# Patient Record
Sex: Female | Born: 1980 | Race: Black or African American | Hispanic: No | Marital: Single | Smoking: Former smoker
Health system: Southern US, Community
[De-identification: ages and names within clinical notes are randomized; demographics above are authoritative.]

## PROBLEM LIST (undated history)

## (undated) DIAGNOSIS — IMO0002 Reserved for concepts with insufficient information to code with codable children: Secondary | ICD-10-CM

## (undated) DIAGNOSIS — D696 Thrombocytopenia, unspecified: Secondary | ICD-10-CM

## (undated) DIAGNOSIS — R87629 Unspecified abnormal cytological findings in specimens from vagina: Secondary | ICD-10-CM

## (undated) DIAGNOSIS — N83209 Unspecified ovarian cyst, unspecified side: Secondary | ICD-10-CM

## (undated) DIAGNOSIS — T7840XA Allergy, unspecified, initial encounter: Secondary | ICD-10-CM

## (undated) DIAGNOSIS — R519 Headache, unspecified: Secondary | ICD-10-CM

## (undated) DIAGNOSIS — N390 Urinary tract infection, site not specified: Secondary | ICD-10-CM

## (undated) DIAGNOSIS — B009 Herpesviral infection, unspecified: Secondary | ICD-10-CM

## (undated) DIAGNOSIS — F329 Major depressive disorder, single episode, unspecified: Secondary | ICD-10-CM

## (undated) DIAGNOSIS — Z8042 Family history of malignant neoplasm of prostate: Secondary | ICD-10-CM

## (undated) DIAGNOSIS — M5431 Sciatica, right side: Secondary | ICD-10-CM

## (undated) DIAGNOSIS — F32A Depression, unspecified: Secondary | ICD-10-CM

## (undated) DIAGNOSIS — G8929 Other chronic pain: Secondary | ICD-10-CM

## (undated) DIAGNOSIS — R011 Cardiac murmur, unspecified: Secondary | ICD-10-CM

## (undated) DIAGNOSIS — R87619 Unspecified abnormal cytological findings in specimens from cervix uteri: Secondary | ICD-10-CM

## (undated) DIAGNOSIS — Z800 Family history of malignant neoplasm of digestive organs: Secondary | ICD-10-CM

## (undated) DIAGNOSIS — R51 Headache: Secondary | ICD-10-CM

## (undated) DIAGNOSIS — Z8049 Family history of malignant neoplasm of other genital organs: Secondary | ICD-10-CM

## (undated) DIAGNOSIS — G43909 Migraine, unspecified, not intractable, without status migrainosus: Secondary | ICD-10-CM

## (undated) DIAGNOSIS — Z8489 Family history of other specified conditions: Secondary | ICD-10-CM

## (undated) DIAGNOSIS — D693 Immune thrombocytopenic purpura: Secondary | ICD-10-CM

## (undated) HISTORY — DX: Urinary tract infection, site not specified: N39.0

## (undated) HISTORY — DX: Family history of malignant neoplasm of other genital organs: Z80.49

## (undated) HISTORY — DX: Sciatica, right side: M54.31

## (undated) HISTORY — DX: Cardiac murmur, unspecified: R01.1

## (undated) HISTORY — DX: Major depressive disorder, single episode, unspecified: F32.9

## (undated) HISTORY — DX: Family history of malignant neoplasm of prostate: Z80.42

## (undated) HISTORY — DX: Immune thrombocytopenic purpura: D69.3

## (undated) HISTORY — DX: Allergy, unspecified, initial encounter: T78.40XA

## (undated) HISTORY — DX: Headache: R51

## (undated) HISTORY — PX: INDUCED ABORTION: SHX677

## (undated) HISTORY — DX: Family history of malignant neoplasm of digestive organs: Z80.0

## (undated) HISTORY — DX: Herpesviral infection, unspecified: B00.9

## (undated) HISTORY — DX: Migraine, unspecified, not intractable, without status migrainosus: G43.909

## (undated) HISTORY — DX: Depression, unspecified: F32.A

## (undated) HISTORY — DX: Headache, unspecified: R51.9

## (undated) HISTORY — DX: Other chronic pain: G89.29

---
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
## 2002-01-14 ENCOUNTER — Other Ambulatory Visit: Admission: RE | Admit: 2002-01-14 | Discharge: 2002-01-14 | Payer: Self-pay | Admitting: Obstetrics and Gynecology

## 2002-08-11 ENCOUNTER — Other Ambulatory Visit: Admission: RE | Admit: 2002-08-11 | Discharge: 2002-08-11 | Payer: Self-pay | Admitting: Obstetrics and Gynecology

## 2002-09-10 ENCOUNTER — Encounter: Payer: Self-pay | Admitting: Emergency Medicine

## 2002-09-10 ENCOUNTER — Emergency Department (HOSPITAL_COMMUNITY): Admission: EM | Admit: 2002-09-10 | Discharge: 2002-09-10 | Payer: Self-pay | Admitting: Emergency Medicine

## 2002-10-09 ENCOUNTER — Emergency Department (HOSPITAL_COMMUNITY): Admission: EM | Admit: 2002-10-09 | Discharge: 2002-10-10 | Payer: Self-pay | Admitting: Emergency Medicine

## 2002-10-18 ENCOUNTER — Emergency Department (HOSPITAL_COMMUNITY): Admission: EM | Admit: 2002-10-18 | Discharge: 2002-10-18 | Payer: Self-pay | Admitting: Emergency Medicine

## 2003-05-02 ENCOUNTER — Inpatient Hospital Stay (HOSPITAL_COMMUNITY): Admission: AD | Admit: 2003-05-02 | Discharge: 2003-05-04 | Payer: Self-pay | Admitting: Obstetrics and Gynecology

## 2003-05-07 ENCOUNTER — Observation Stay (HOSPITAL_COMMUNITY): Admission: AD | Admit: 2003-05-07 | Discharge: 2003-05-07 | Payer: Self-pay | Admitting: Obstetrics and Gynecology

## 2003-05-14 ENCOUNTER — Inpatient Hospital Stay (HOSPITAL_COMMUNITY): Admission: AD | Admit: 2003-05-14 | Discharge: 2003-05-17 | Payer: Self-pay | Admitting: Obstetrics & Gynecology

## 2003-05-21 ENCOUNTER — Encounter: Admission: RE | Admit: 2003-05-21 | Discharge: 2003-06-20 | Payer: Self-pay | Admitting: Obstetrics and Gynecology

## 2003-06-21 ENCOUNTER — Encounter: Admission: RE | Admit: 2003-06-21 | Discharge: 2003-07-21 | Payer: Self-pay | Admitting: Obstetrics and Gynecology

## 2003-07-22 ENCOUNTER — Encounter: Admission: RE | Admit: 2003-07-22 | Discharge: 2003-08-21 | Payer: Self-pay | Admitting: Obstetrics and Gynecology

## 2003-08-22 ENCOUNTER — Encounter: Admission: RE | Admit: 2003-08-22 | Discharge: 2003-09-21 | Payer: Self-pay | Admitting: Obstetrics and Gynecology

## 2003-10-12 ENCOUNTER — Emergency Department (HOSPITAL_COMMUNITY): Admission: EM | Admit: 2003-10-12 | Discharge: 2003-10-12 | Payer: Self-pay | Admitting: Emergency Medicine

## 2003-10-22 ENCOUNTER — Encounter: Admission: RE | Admit: 2003-10-22 | Discharge: 2003-11-21 | Payer: Self-pay | Admitting: Obstetrics and Gynecology

## 2003-11-19 ENCOUNTER — Inpatient Hospital Stay (HOSPITAL_COMMUNITY): Admission: AD | Admit: 2003-11-19 | Discharge: 2003-11-19 | Payer: Self-pay | Admitting: Obstetrics and Gynecology

## 2004-08-27 ENCOUNTER — Emergency Department (HOSPITAL_COMMUNITY): Admission: EM | Admit: 2004-08-27 | Discharge: 2004-08-27 | Payer: Self-pay | Admitting: Family Medicine

## 2004-09-13 ENCOUNTER — Emergency Department (HOSPITAL_COMMUNITY): Admission: EM | Admit: 2004-09-13 | Discharge: 2004-09-13 | Payer: Self-pay | Admitting: Family Medicine

## 2004-10-21 ENCOUNTER — Inpatient Hospital Stay (HOSPITAL_COMMUNITY): Admission: AD | Admit: 2004-10-21 | Discharge: 2004-10-22 | Payer: Self-pay | Admitting: Obstetrics & Gynecology

## 2004-10-28 ENCOUNTER — Inpatient Hospital Stay (HOSPITAL_COMMUNITY): Admission: AD | Admit: 2004-10-28 | Discharge: 2004-10-28 | Payer: Self-pay | Admitting: Family Medicine

## 2004-10-30 ENCOUNTER — Inpatient Hospital Stay (HOSPITAL_COMMUNITY): Admission: AD | Admit: 2004-10-30 | Discharge: 2004-10-30 | Payer: Self-pay | Admitting: Obstetrics and Gynecology

## 2004-11-18 ENCOUNTER — Emergency Department (HOSPITAL_COMMUNITY): Admission: EM | Admit: 2004-11-18 | Discharge: 2004-11-18 | Payer: Self-pay | Admitting: Family Medicine

## 2005-01-04 ENCOUNTER — Emergency Department (HOSPITAL_COMMUNITY): Admission: EM | Admit: 2005-01-04 | Discharge: 2005-01-04 | Payer: Self-pay | Admitting: Family Medicine

## 2005-07-04 ENCOUNTER — Inpatient Hospital Stay (HOSPITAL_COMMUNITY): Admission: AD | Admit: 2005-07-04 | Discharge: 2005-07-04 | Payer: Self-pay | Admitting: *Deleted

## 2005-09-18 ENCOUNTER — Inpatient Hospital Stay (HOSPITAL_COMMUNITY): Admission: AD | Admit: 2005-09-18 | Discharge: 2005-09-19 | Payer: Self-pay | Admitting: Internal Medicine

## 2005-11-13 ENCOUNTER — Inpatient Hospital Stay (HOSPITAL_COMMUNITY): Admission: AD | Admit: 2005-11-13 | Discharge: 2005-11-13 | Payer: Self-pay | Admitting: Gynecology

## 2006-04-14 ENCOUNTER — Inpatient Hospital Stay (HOSPITAL_COMMUNITY): Admission: AD | Admit: 2006-04-14 | Discharge: 2006-04-14 | Payer: Self-pay | Admitting: Family Medicine

## 2006-08-09 ENCOUNTER — Emergency Department (HOSPITAL_COMMUNITY): Admission: EM | Admit: 2006-08-09 | Discharge: 2006-08-09 | Payer: Self-pay | Admitting: Emergency Medicine

## 2008-02-10 ENCOUNTER — Emergency Department (HOSPITAL_COMMUNITY): Admission: EM | Admit: 2008-02-10 | Discharge: 2008-02-10 | Payer: Self-pay | Admitting: Family Medicine

## 2008-03-04 ENCOUNTER — Emergency Department (HOSPITAL_COMMUNITY): Admission: EM | Admit: 2008-03-04 | Discharge: 2008-03-04 | Payer: Self-pay | Admitting: Family Medicine

## 2009-01-07 ENCOUNTER — Emergency Department (HOSPITAL_COMMUNITY): Admission: EM | Admit: 2009-01-07 | Discharge: 2009-01-07 | Payer: Self-pay | Admitting: Emergency Medicine

## 2009-01-10 ENCOUNTER — Emergency Department (HOSPITAL_COMMUNITY): Admission: EM | Admit: 2009-01-10 | Discharge: 2009-01-10 | Payer: Self-pay | Admitting: Family Medicine

## 2009-01-17 ENCOUNTER — Emergency Department (HOSPITAL_COMMUNITY): Admission: EM | Admit: 2009-01-17 | Discharge: 2009-01-17 | Payer: Self-pay | Admitting: Emergency Medicine

## 2009-02-22 ENCOUNTER — Emergency Department (HOSPITAL_COMMUNITY): Admission: EM | Admit: 2009-02-22 | Discharge: 2009-02-22 | Payer: Self-pay | Admitting: Emergency Medicine

## 2009-03-02 ENCOUNTER — Emergency Department (HOSPITAL_COMMUNITY): Admission: EM | Admit: 2009-03-02 | Discharge: 2009-03-02 | Payer: Self-pay | Admitting: Emergency Medicine

## 2009-03-05 ENCOUNTER — Ambulatory Visit: Payer: Self-pay | Admitting: Oncology

## 2009-03-12 ENCOUNTER — Emergency Department (HOSPITAL_COMMUNITY): Admission: EM | Admit: 2009-03-12 | Discharge: 2009-03-12 | Payer: Self-pay | Admitting: Family Medicine

## 2009-04-05 ENCOUNTER — Other Ambulatory Visit: Payer: Self-pay | Admitting: Oncology

## 2009-04-05 ENCOUNTER — Ambulatory Visit: Payer: Self-pay | Admitting: Oncology

## 2009-04-05 LAB — CBC WITH DIFFERENTIAL/PLATELET
BASO%: 0.5 % (ref 0.0–2.0)
Basophils Absolute: 0 10e3/uL (ref 0.0–0.1)
EOS%: 3.2 % (ref 0.0–7.0)
Eosinophils Absolute: 0.1 10e3/uL (ref 0.0–0.5)
HCT: 38 % (ref 34.8–46.6)
HGB: 12.9 g/dL (ref 11.6–15.9)
LYMPH%: 46.9 % (ref 14.0–49.7)
MCH: 30.9 pg (ref 25.1–34.0)
MCHC: 34.1 g/dL (ref 31.5–36.0)
MCV: 90.6 fL (ref 79.5–101.0)
MONO#: 0.3 10e3/uL (ref 0.1–0.9)
MONO%: 8.2 % (ref 0.0–14.0)
NEUT#: 1.6 10e3/uL (ref 1.5–6.5)
NEUT%: 41.2 % (ref 38.4–76.8)
Platelets: 90 10e3/uL — ABNORMAL LOW (ref 145–400)
RBC: 4.19 10e6/uL (ref 3.70–5.45)
RDW: 12 % (ref 11.2–14.5)
WBC: 3.8 10e3/uL — ABNORMAL LOW (ref 3.9–10.3)
lymph#: 1.8 10e3/uL (ref 0.9–3.3)

## 2009-04-05 LAB — CHCC SMEAR

## 2009-04-05 LAB — TECHNOLOGIST REVIEW

## 2009-04-06 LAB — ANA: Anti Nuclear Antibody(ANA): NEGATIVE

## 2009-04-06 LAB — RHEUMATOID FACTOR: Rhuematoid fact SerPl-aCnc: <20 IU/mL (ref 0–20)

## 2009-04-20 ENCOUNTER — Emergency Department (HOSPITAL_COMMUNITY): Admission: EM | Admit: 2009-04-20 | Discharge: 2009-04-20 | Payer: Self-pay | Admitting: Family Medicine

## 2009-06-21 ENCOUNTER — Emergency Department (HOSPITAL_COMMUNITY): Admission: EM | Admit: 2009-06-21 | Discharge: 2009-06-21 | Payer: Self-pay | Admitting: Emergency Medicine

## 2009-07-02 ENCOUNTER — Ambulatory Visit: Payer: Self-pay | Admitting: Oncology

## 2009-07-06 LAB — CBC WITH DIFFERENTIAL/PLATELET
Eosinophils Absolute: 0.2 10*3/uL (ref 0.0–0.5)
HCT: 38.8 % (ref 34.8–46.6)
HGB: 13.2 g/dL (ref 11.6–15.9)
MCH: 30.9 pg (ref 25.1–34.0)
MCV: 90.7 fL (ref 79.5–101.0)
MONO%: 7.2 % (ref 0.0–14.0)
NEUT#: 2.5 10*3/uL (ref 1.5–6.5)
Platelets: 84 10*3/uL — ABNORMAL LOW (ref 145–400)
RBC: 4.28 10*6/uL (ref 3.70–5.45)
WBC: 4.9 10*3/uL (ref 3.9–10.3)

## 2009-08-12 ENCOUNTER — Emergency Department (HOSPITAL_COMMUNITY): Admission: EM | Admit: 2009-08-12 | Discharge: 2009-08-12 | Payer: Self-pay | Admitting: Emergency Medicine

## 2009-10-29 ENCOUNTER — Ambulatory Visit: Payer: Self-pay | Admitting: Oncology

## 2009-11-02 LAB — CBC WITH DIFFERENTIAL/PLATELET
Basophils Absolute: 0 10*3/uL (ref 0.0–0.1)
Eosinophils Absolute: 0.2 10*3/uL (ref 0.0–0.5)
HCT: 39.9 % (ref 34.8–46.6)
HGB: 13.6 g/dL (ref 11.6–15.9)
LYMPH%: 52.4 % — ABNORMAL HIGH (ref 14.0–49.7)
MCH: 29.7 pg (ref 25.1–34.0)
MONO#: 0.5 10*3/uL (ref 0.1–0.9)
NEUT%: 33.1 % — ABNORMAL LOW (ref 38.4–76.8)
Platelets: 83 10*3/uL — ABNORMAL LOW (ref 145–400)
WBC: 4.7 10*3/uL (ref 3.9–10.3)

## 2010-01-26 ENCOUNTER — Emergency Department (HOSPITAL_COMMUNITY): Admission: EM | Admit: 2010-01-26 | Discharge: 2010-01-26 | Payer: Self-pay | Admitting: Family Medicine

## 2010-03-02 ENCOUNTER — Ambulatory Visit: Payer: Self-pay | Admitting: Oncology

## 2010-03-04 LAB — CBC WITH DIFFERENTIAL/PLATELET
BASO%: 0.5 % (ref 0.0–2.0)
EOS%: 6.2 % (ref 0.0–7.0)
HGB: 13.2 g/dL (ref 11.6–15.9)
LYMPH%: 46.3 % (ref 14.0–49.7)
MCH: 30.9 pg (ref 25.1–34.0)
MCHC: 34.7 g/dL (ref 31.5–36.0)
MONO%: 11.2 % (ref 0.0–14.0)
NEUT#: 1.5 10*3/uL (ref 1.5–6.5)
Platelets: 122 10*3/uL — ABNORMAL LOW (ref 145–400)

## 2010-06-12 ENCOUNTER — Encounter: Payer: Self-pay | Admitting: Obstetrics & Gynecology

## 2010-08-04 LAB — POCT URINALYSIS DIPSTICK
Bilirubin Urine: NEGATIVE
Nitrite: NEGATIVE
Protein, ur: NEGATIVE mg/dL
Specific Gravity, Urine: 1.01 (ref 1.005–1.030)
Urobilinogen, UA: 0.2 mg/dL (ref 0.0–1.0)
pH: 7 (ref 5.0–8.0)

## 2010-08-04 LAB — GC/CHLAMYDIA PROBE AMP, GENITAL
Chlamydia, DNA Probe: NEGATIVE
GC Probe Amp, Genital: NEGATIVE

## 2010-08-04 LAB — WET PREP, GENITAL: Yeast Wet Prep HPF POC: NONE SEEN

## 2010-08-12 LAB — POCT URINALYSIS DIP (DEVICE)
Bilirubin Urine: NEGATIVE
Hgb urine dipstick: NEGATIVE
Nitrite: NEGATIVE
Protein, ur: NEGATIVE mg/dL
Specific Gravity, Urine: 1.01 (ref 1.005–1.030)

## 2010-08-25 LAB — POCT I-STAT, CHEM 8
Chloride: 104 mEq/L (ref 96–112)
Creatinine, Ser: 1.1 mg/dL (ref 0.4–1.2)
HCT: 43 % (ref 36.0–46.0)
Potassium: 4.2 mEq/L (ref 3.5–5.1)
Sodium: 138 mEq/L (ref 135–145)

## 2010-08-25 LAB — CBC
HCT: 39.5 % (ref 36.0–46.0)
Hemoglobin: 13.7 g/dL (ref 12.0–15.0)
MCHC: 34.8 g/dL (ref 30.0–36.0)
Platelets: 83 10*3/uL — ABNORMAL LOW (ref 150–400)
RBC: 4.37 MIL/uL (ref 3.87–5.11)
WBC: 3.6 10*3/uL — ABNORMAL LOW (ref 4.0–10.5)

## 2010-08-25 LAB — POCT URINALYSIS DIP (DEVICE)
Protein, ur: 100 mg/dL — AB
Specific Gravity, Urine: 1.015 (ref 1.005–1.030)
Urobilinogen, UA: 0.2 mg/dL (ref 0.0–1.0)

## 2010-08-25 LAB — DIFFERENTIAL
Basophils Absolute: 0 10*3/uL (ref 0.0–0.1)
Basophils Relative: 1 % (ref 0–1)
Lymphs Abs: 2 10*3/uL (ref 0.7–4.0)
Monocytes Relative: 9 % (ref 3–12)

## 2010-08-25 LAB — HIV ANTIBODY (ROUTINE TESTING W REFLEX): HIV: NONREACTIVE

## 2010-08-25 LAB — POCT PREGNANCY, URINE: Preg Test, Ur: NEGATIVE

## 2010-08-27 LAB — POCT URINALYSIS DIP (DEVICE)
Bilirubin Urine: NEGATIVE
Hgb urine dipstick: NEGATIVE
Nitrite: NEGATIVE
Protein, ur: NEGATIVE mg/dL
Urobilinogen, UA: 1 mg/dL (ref 0.0–1.0)

## 2010-08-27 LAB — CBC
HCT: 38.3 % (ref 36.0–46.0)
HCT: 39.4 % (ref 36.0–46.0)
Hemoglobin: 12.9 g/dL (ref 12.0–15.0)
MCHC: 33.8 g/dL (ref 30.0–36.0)
MCV: 90.5 fL (ref 78.0–100.0)
MCV: 90.7 fL (ref 78.0–100.0)
RBC: 4.23 MIL/uL (ref 3.87–5.11)
RDW: 12.9 % (ref 11.5–15.5)

## 2010-08-27 LAB — DIFFERENTIAL
Basophils Absolute: 0 10*3/uL (ref 0.0–0.1)
Basophils Relative: 1 % (ref 0–1)
Eosinophils Absolute: 0.2 10*3/uL (ref 0.0–0.7)
Eosinophils Relative: 3 % (ref 0–5)
Lymphocytes Relative: 27 % (ref 12–46)
Monocytes Absolute: 0.4 10*3/uL (ref 0.1–1.0)
Monocytes Absolute: 0.6 10*3/uL (ref 0.1–1.0)
Monocytes Relative: 11 % (ref 3–12)
Monocytes Relative: 9 % (ref 3–12)
Neutro Abs: 3.9 10*3/uL (ref 1.7–7.7)
Neutrophils Relative %: 61 % (ref 43–77)

## 2010-08-27 LAB — GC/CHLAMYDIA PROBE AMP, GENITAL: GC Probe Amp, Genital: NEGATIVE

## 2010-09-03 ENCOUNTER — Inpatient Hospital Stay (HOSPITAL_COMMUNITY)
Admission: AD | Admit: 2010-09-03 | Discharge: 2010-09-03 | Disposition: A | Discharge status: Home / Self Care | Payer: Self-pay | Source: Ambulatory Visit | Attending: Obstetrics and Gynecology | Admitting: Obstetrics and Gynecology

## 2010-09-03 ENCOUNTER — Inpatient Hospital Stay (HOSPITAL_COMMUNITY): Payer: Self-pay

## 2010-09-03 DIAGNOSIS — R1032 Left lower quadrant pain: Secondary | ICD-10-CM

## 2010-09-03 DIAGNOSIS — B9689 Other specified bacterial agents as the cause of diseases classified elsewhere: Secondary | ICD-10-CM | POA: Insufficient documentation

## 2010-09-03 DIAGNOSIS — N83209 Unspecified ovarian cyst, unspecified side: Secondary | ICD-10-CM | POA: Insufficient documentation

## 2010-09-03 DIAGNOSIS — A499 Bacterial infection, unspecified: Secondary | ICD-10-CM

## 2010-09-03 DIAGNOSIS — N760 Acute vaginitis: Secondary | ICD-10-CM | POA: Insufficient documentation

## 2010-09-03 LAB — URINALYSIS, ROUTINE W REFLEX MICROSCOPIC
Glucose, UA: NEGATIVE mg/dL
Hgb urine dipstick: NEGATIVE
Ketones, ur: NEGATIVE mg/dL
Protein, ur: NEGATIVE mg/dL

## 2010-09-03 LAB — WET PREP, GENITAL: Yeast Wet Prep HPF POC: NONE SEEN

## 2010-09-05 LAB — GC/CHLAMYDIA PROBE AMP, GENITAL: GC Probe Amp, Genital: NEGATIVE

## 2010-09-08 ENCOUNTER — Other Ambulatory Visit: Payer: Self-pay | Admitting: Oncology

## 2010-09-08 ENCOUNTER — Encounter (HOSPITAL_BASED_OUTPATIENT_CLINIC_OR_DEPARTMENT_OTHER): Payer: Self-pay | Admitting: Oncology

## 2010-09-08 DIAGNOSIS — D72819 Decreased white blood cell count, unspecified: Secondary | ICD-10-CM

## 2010-09-08 DIAGNOSIS — D72829 Elevated white blood cell count, unspecified: Secondary | ICD-10-CM

## 2010-09-08 DIAGNOSIS — D696 Thrombocytopenia, unspecified: Secondary | ICD-10-CM

## 2010-09-08 DIAGNOSIS — D709 Neutropenia, unspecified: Secondary | ICD-10-CM

## 2010-09-08 LAB — CBC WITH DIFFERENTIAL/PLATELET
BASO%: 0.4 % (ref 0.0–2.0)
Eosinophils Absolute: 0.2 10*3/uL (ref 0.0–0.5)
MCHC: 34.4 g/dL (ref 31.5–36.0)
MCV: 88.8 fL (ref 79.5–101.0)
MONO%: 12.1 % (ref 0.0–14.0)
NEUT#: 2 10*3/uL (ref 1.5–6.5)
RBC: 4.34 10*6/uL (ref 3.70–5.45)
RDW: 12.3 % (ref 11.2–14.5)
WBC: 4.6 10*3/uL (ref 3.9–10.3)

## 2010-09-20 ENCOUNTER — Inpatient Hospital Stay (HOSPITAL_COMMUNITY)
Admission: AD | Admit: 2010-09-20 | Discharge: 2010-09-20 | Disposition: A | Discharge status: Home / Self Care | Payer: Self-pay | Source: Ambulatory Visit | Attending: Obstetrics & Gynecology | Admitting: Obstetrics & Gynecology

## 2010-09-20 ENCOUNTER — Inpatient Hospital Stay (HOSPITAL_COMMUNITY): Payer: Self-pay

## 2010-09-20 DIAGNOSIS — R109 Unspecified abdominal pain: Secondary | ICD-10-CM

## 2010-09-20 DIAGNOSIS — N938 Other specified abnormal uterine and vaginal bleeding: Secondary | ICD-10-CM | POA: Insufficient documentation

## 2010-09-20 DIAGNOSIS — N949 Unspecified condition associated with female genital organs and menstrual cycle: Secondary | ICD-10-CM | POA: Insufficient documentation

## 2010-09-20 LAB — CBC
MCH: 29.8 pg (ref 26.0–34.0)
MCHC: 34.2 g/dL (ref 30.0–36.0)
Platelets: 92 10*3/uL — ABNORMAL LOW (ref 150–400)
RDW: 12.5 % (ref 11.5–15.5)

## 2010-09-20 LAB — URINALYSIS, ROUTINE W REFLEX MICROSCOPIC
Bilirubin Urine: NEGATIVE
Ketones, ur: NEGATIVE mg/dL
Nitrite: NEGATIVE
Protein, ur: NEGATIVE mg/dL
Urobilinogen, UA: 0.2 mg/dL (ref 0.0–1.0)
pH: 6 (ref 5.0–8.0)

## 2010-09-20 LAB — POCT PREGNANCY, URINE: Preg Test, Ur: NEGATIVE

## 2010-10-07 NOTE — Discharge Summary (Signed)
Samantha Swanson, MINAMI                          ACCOUNT NO.:  [id number]   MEDICAL RECORD NO.:  [id number]                   PATIENT TYPE:  INP   LOCATION:  9115                                 FACILITY:  [facility]   PHYSICIAN:  Miguel Aschoff, M.D.                    DATE OF BIRTH:  08-20-1980   DATE OF ADMISSION:  05/14/2003  DATE OF DISCHARGE:  05/17/2003                                 DISCHARGE SUMMARY   FINAL DIAGNOSES:  1. Intrauterine pregnancy at 35-4/[redacted] weeks gestation, in labor.  2. Vacuum-assisted delivery of a female infant with Apgars of 8 and 9.   This 30 year old, G1, P0, presents at 35-3/[redacted] weeks gestation in labor. The  patient was hospitalized earlier this pregnancy for preterm labor and did  receive betamethasone back around May 03, 2003. At that admission, the  patient's cervix was 1 cm and now about 4 cm dilated and contracting  regularly. Her antepartum course had been complicated by the history of  preterm labor. The patient also had some thrombocytopenia, a history of  early marijuana use during her pregnancy, and early smoker which did she  quit during her pregnancy. The patient is also a single female. Group B  strep cultures had not obtained at this point, so the patient was started on  IV penicillin for group B strep prophylaxis. The patient continued in labor  and dilated to complete and complete. She progressed to about a +3 station  where she had been noted to be having some variable decelerations.  Therefore, a  Tender Touch vacuum extractor was applied by Dr. Duane Lope and  with a single pull she delivered a 5 pound 10 ounce female infant with  Apgars of 8 and 9. Delivery went throughout complication. There was a left  labia laceration that was repaired. There was a nuchal cord times one noted  as well. The patient's postpartum course was benign without significant  fevers and she was felt ready for discharge on postpartum day #2. She was  sent home on a  regular diet, told to continue prenatal vitamins, was given a  prescription for Darvocet N-100 one q.4h. p.r.n. pain, was told to follow up  in the office in four weeks.   LABORATORY ON DISCHARGE:  The patient had a hemoglobin of 10.9, white blood  cell count 10.6.     [name], P.A.-C.                Miguel Aschoff, M.D.    MB/MEDQ  D:  06/19/2003  T:  06/20/2003  Job:  [id number]

## 2010-10-07 NOTE — Discharge Summary (Signed)
Samantha Swanson, Samantha Swanson                          ACCOUNT NO.:  [id number]   MEDICAL RECORD NO.:  [id number]                   PATIENT TYPE:  OBV   LOCATION:  9149                                 [facility]ACILITY:  [facility]   PHYSICIAN:  Miguel Aschoff, M.D.                    DATE OF BIRTH:  April 29, 1981   DATE OF ADMISSION:  05/07/2003  DATE OF DISCHARGE:  05/07/2003                                 DISCHARGE SUMMARY   FINAL DIAGNOSES:  1. Intrauterine pregnancy at 22 and 3/7ths week gestation.  2. Preterm labor.   HISTORY OF PRESENT ILLNESS:  This 30 year old, G1, P0, presents around 66  and 3/7ths weeks gestation complaining of contractions and pressure again.  The patient did have terbutaline twice in triage, did not resolve, and  therefore was admitted for magnesium sulfate.  The patient had been admitted  back on May 02, 2003 for betamethasone and magnesium sulfate, and was  sent home on Procardia.  The patient is admitted at this time.   HOSPITAL COURSE:  She was given a magnesium sulfate bolus, and was on  observation.  The patient was feeling better later in the day, and was felt  ready for discharge.   DISCHARGE MEDICATIONS:  Terbutaline 5 mg one q.4h.   FOLLOW UP:  She was to follow up in the office in 1 weeks, and of course to  call with increased contractions.   ACTIVITY:  The patient was to increase her rest.     Samantha Swanson, P.A.-C.                Miguel Aschoff, M.D.    MB/MEDQ  D:  07/06/2003  T:  07/06/2003  Job:  [id number]

## 2010-10-07 NOTE — Discharge Summary (Signed)
Samantha Swanson, Samantha Swanson                          ACCOUNT NO.:  [id number]   MEDICAL RECORD NO.:  [id number]                   PATIENT TYPE:  INP   LOCATION:  9149                                 [facility]ACILITY:  [facility]   PHYSICIAN:  Malva Limes, M.D.                 DATE OF BIRTH:  05/14/1981   DATE OF ADMISSION:  05/02/2003  DATE OF DISCHARGE:  05/04/2003                                 DISCHARGE SUMMARY   FINAL DIAGNOSES:  1. Thirty-three and five-sevenths weeks gestation.  2. Preterm labor.  3. Thrombocytopenia.   This 30 year old G1 P0 presents at 32 and five-sevenths weeks gestation with  left lower quadrant pain and pressure.  The patient's antepartum course up  to this point had been complicated by a history of smoking early in the  pregnancy; she did quit early in the pregnancy.  She also had a history of  marijuana use early in the pregnancy, and she had some thrombocytopenia.  At  this point she was complaining of some contractions.  She had been afebrile.  Upon admission her cervix was already dilated to 1, 70% effaced, at a -2  station.  Her labs and urinalysis except for the thrombocytopenia were all  normal.  The patient was admitted and started on magnesium sulfate and  betamethasone protocol.  She was also started on ampicillin.  While on the  magnesium sulfate the patient's pressure and contractions resolved, she was  feeling better, she received her two doses of betamethasone, and was able to  stop the magnesium sulfate by hospital day #2.  Hospital day #3 the patient  had been off the magnesium now, was feeling better, still having a couple of  contractions, but was felt ready for discharge.  She was started on  Procardia 10 mg one q.6h. as needed.  She was felt ready for discharge on  hospital day #3.  She was sent home on a regular diet, told to decrease  activity, told to continue her prenatal vitamins, was sent home on a  prescription for Procardia XL 30 mg one daily,  and of course was to call if  contractions resumed.   LABORATORY DATA ON DISCHARGE:  The patient had a hemoglobin of 12.7, white  blood cell count of 5.8, and otherwise, normal labs.     [name], P.A.-C.                Malva Limes, M.D.    MB/MEDQ  D:  07/06/2003  T:  07/06/2003  Job:  [id number]

## 2010-10-12 ENCOUNTER — Encounter: Payer: Self-pay | Admitting: Obstetrics & Gynecology

## 2010-10-12 ENCOUNTER — Encounter: Payer: Self-pay | Admitting: Family Medicine

## 2010-10-18 ENCOUNTER — Ambulatory Visit (INDEPENDENT_AMBULATORY_CARE_PROVIDER_SITE_OTHER): Payer: Self-pay

## 2010-10-18 ENCOUNTER — Inpatient Hospital Stay (INDEPENDENT_AMBULATORY_CARE_PROVIDER_SITE_OTHER)
Admission: RE | Admit: 2010-10-18 | Discharge: 2010-10-18 | Disposition: A | Discharge status: Home / Self Care | Payer: Self-pay | Source: Ambulatory Visit | Attending: Family Medicine | Admitting: Family Medicine

## 2010-10-18 DIAGNOSIS — J40 Bronchitis, not specified as acute or chronic: Secondary | ICD-10-CM

## 2010-10-18 DIAGNOSIS — R0789 Other chest pain: Secondary | ICD-10-CM

## 2010-11-28 ENCOUNTER — Ambulatory Visit (INDEPENDENT_AMBULATORY_CARE_PROVIDER_SITE_OTHER): Payer: Self-pay | Admitting: Internal Medicine

## 2010-11-28 ENCOUNTER — Encounter: Payer: Self-pay | Admitting: Internal Medicine

## 2010-11-28 VITALS — BP 118/80 | HR 84 | Temp 98.0°F | Ht 64.0 in | Wt 150.1 lb

## 2010-11-28 DIAGNOSIS — D693 Immune thrombocytopenic purpura: Secondary | ICD-10-CM | POA: Insufficient documentation

## 2010-11-28 DIAGNOSIS — M5430 Sciatica, unspecified side: Secondary | ICD-10-CM

## 2010-11-28 DIAGNOSIS — F329 Major depressive disorder, single episode, unspecified: Secondary | ICD-10-CM | POA: Insufficient documentation

## 2010-11-28 DIAGNOSIS — R519 Headache, unspecified: Secondary | ICD-10-CM | POA: Insufficient documentation

## 2010-11-28 DIAGNOSIS — F411 Generalized anxiety disorder: Secondary | ICD-10-CM

## 2010-11-28 DIAGNOSIS — R011 Cardiac murmur, unspecified: Secondary | ICD-10-CM | POA: Insufficient documentation

## 2010-11-28 DIAGNOSIS — Z0000 Encounter for general adult medical examination without abnormal findings: Secondary | ICD-10-CM | POA: Insufficient documentation

## 2010-11-28 DIAGNOSIS — M5431 Sciatica, right side: Secondary | ICD-10-CM

## 2010-11-28 DIAGNOSIS — T7840XA Allergy, unspecified, initial encounter: Secondary | ICD-10-CM | POA: Insufficient documentation

## 2010-11-28 DIAGNOSIS — A6000 Herpesviral infection of urogenital system, unspecified: Secondary | ICD-10-CM | POA: Insufficient documentation

## 2010-11-28 DIAGNOSIS — F419 Anxiety disorder, unspecified: Secondary | ICD-10-CM

## 2010-11-28 DIAGNOSIS — G43909 Migraine, unspecified, not intractable, without status migrainosus: Secondary | ICD-10-CM

## 2010-11-28 HISTORY — DX: Sciatica, right side: M54.31

## 2010-11-28 MED ORDER — PAROXETINE HCL 20 MG PO TABS: 20.0000 mg | ORAL_TABLET | Freq: Every day | ORAL | Status: DC

## 2010-11-28 NOTE — Assessment & Plan Note (Signed)
stable overall by hx and exam, verified nonsuicidal

## 2010-11-28 NOTE — Assessment & Plan Note (Signed)
Plans to see Dr Elisabeth Most q 6 mo

## 2010-11-28 NOTE — Patient Instructions (Addendum)
Take all new medications as prescribed Continue all other medications as before You can use the Excedrin Migraine +/- alleve OTC for the migraines You can also use the alleve for the recurring back pain if needed Please keep your appointments with your specialists as you have planned - Dr Sherrill/hematology Please return in 1 year for your yearly visit, or sooner if needed

## 2010-11-28 NOTE — Assessment & Plan Note (Signed)
Chronic stable, Continue all other medications as before  

## 2010-11-28 NOTE — Assessment & Plan Note (Signed)
Typical it seems, ideally would need CNS imaging as this has not been done, but will hold at this time due to cost per pt and mother,  For excedrin migraine, and alleve otc prn ,  to f/u any worsening symptoms or concerns

## 2010-11-28 NOTE — Assessment & Plan Note (Signed)
Moderate it seems, to start paxil 20 mg, declines counsleing or pscyhiatry referral due to cost,  to f/u any worsening symptoms or concerns

## 2010-11-28 NOTE — Progress Notes (Signed)
Subjective:    Patient ID: Samantha Swanson, female    DOB: 1981-02-25, 30 y.o.   MRN: [id number]  HPI  Here for wellness and f/u;  Overall doing ok;  Pt denies CP, worsening SOB, DOE, wheezing, orthopnea, PND, worsening LE edema, palpitations,.  Pt denies neurological change such as  facial or extremity weakness.  Pt denies polydipsia, polyuria, or low sugar symptoms. Pt states overall good compliance with treatment and medications, good tolerability, and trying to follow lower cholesterol diet.  Pt denies worsening depressive symptoms, suicidal ideation or panic. No fever, wt loss, loss of appetite, or other constitutional symptoms.  Pt states good ability with ADL's, low fall risk, home safety reviewed and adequate, no significant changes in hearing or vision, and occasionally active with exercise. Does have freq night sweats about twice per wk, ongoing for yrs but worse recently, occurs twice per wk on average,  Gets dizzy off and on for yrs , no syncope.  Denies worsening depressive symptoms, suicidal ideation, though has ongoing anxiety disorder, some increased recently, with occasional panic attack (" all day every day." "I know when it's coming and cant keep still, so I walk around." )  Helps to "do creative things." such as planting in the yard or writing something.  Was seen 2003 at [facility] ER and behavioral health, and event tx with paxil and seroquel, does not remember how it really helped, only took for about 3-4 months, then stopped. Mom here today - does state she thinks helped calm her down. Peak pregnancy wt was 224 in 2005, now down to 150 intentional, is vegetarian now. Pt continues to have recurring right LBP without change in severity, bowel or bladder change, fever, wt loss,  worsening LE pain/numbness/weakness, gait change or falls. Also with recurring typical migraine type HA, now about 10 times per month, mild to mod, without neuro change o/ Past Medical History  Diagnosis Date  . Chronic  headaches   . Migraines   . UTI (lower urinary tract infection)   . ITP (idiopathic thrombocytopenic purpura)   . Genital herpes   . Allergy   . Depression   . Heart murmur    History reviewed. No pertinent past surgical history.  reports that she has quit smoking. She does not have any smokeless tobacco history on file. She reports that she uses illicit drugs. She reports that she does not drink alcohol. family history includes Cancer in her other; Heart disease in her other; Hypertension in her other; Mental illness in her others; and Stroke in her other. No Known Allergies No current outpatient prescriptions on file prior to visit.   Review of Systems Review of Systems  Constitutional: Negative for diaphoresis and unexpected weight change.  HENT: Negative for drooling and tinnitus.   Eyes: Negative for photophobia and visual disturbance.  Respiratory: Negative for choking and stridor.   Gastrointestinal: Negative for vomiting and blood in stool.  Genitourinary: Negative for hematuria and decreased urine volume.  Musculoskeletal: Negative for gait problem.  Skin: Negative for color change and wound.  Neurological: Negative for tremors and numbness.  Psychiatric/Behavioral: Negative for decreased concentration. The patient is not hyperactive.       Objective:   Physical Exam BP 118/80  Pulse 84  Temp(Src) 98 F (36.7 C) (Oral)  Ht 5\' 4"  (1.626 m)  Wt 150 lb 2 oz (68.096 kg)  BMI 25.77 kg/m2  SpO2 97%  LMP 11/18/2010 Physical Exam  VS noted Constitutional: Pt appears well-developed and  well-nourished.  HENT: Head: Normocephalic.  Right Ear: External ear normal.  Left Ear: External ear normal.  Eyes: Conjunctivae and EOM are normal. Pupils are equal, round, and reactive to light.  Neck: Normal range of motion. Neck supple.  Cardiovascular: Normal rate and regular rhythm.   Pulmonary/Chest: Effort normal and breath sounds normal.  Abd:  Soft, NT, non-distended, +  BS Neurological: Pt is alert. No cranial nerve deficit. motor/sens/dtr/gait intact Skin: Skin is warm. No erythema.  Psychiatric: Pt behavior is normal. Thought content normal. 2+ nervous        Assessment & Plan:

## 2011-03-21 ENCOUNTER — Inpatient Hospital Stay (INDEPENDENT_AMBULATORY_CARE_PROVIDER_SITE_OTHER)
Admission: RE | Admit: 2011-03-21 | Discharge: 2011-03-21 | Disposition: A | Discharge status: Home / Self Care | Payer: Self-pay | Source: Ambulatory Visit | Attending: Family Medicine | Admitting: Family Medicine

## 2011-03-21 DIAGNOSIS — N390 Urinary tract infection, site not specified: Secondary | ICD-10-CM

## 2011-03-21 LAB — POCT URINALYSIS DIP (DEVICE)
Ketones, ur: NEGATIVE mg/dL
Protein, ur: 30 mg/dL — AB
Specific Gravity, Urine: 1.015 (ref 1.005–1.030)
Urobilinogen, UA: 0.2 mg/dL (ref 0.0–1.0)
pH: 7 (ref 5.0–8.0)

## 2011-03-21 LAB — POCT PREGNANCY, URINE: Preg Test, Ur: NEGATIVE

## 2011-04-05 ENCOUNTER — Emergency Department (HOSPITAL_COMMUNITY)
Admission: EM | Admit: 2011-04-05 | Discharge: 2011-04-05 | Disposition: A | Discharge status: Home / Self Care | Payer: Self-pay | Source: Home / Self Care | Attending: Family Medicine | Admitting: Family Medicine

## 2011-04-05 NOTE — ED Notes (Signed)
Pt called x 3 .  °

## 2011-04-05 NOTE — ED Notes (Signed)
Pt called x1

## 2011-04-05 NOTE — ED Notes (Signed)
Patient called x 3 without answer

## 2011-04-05 NOTE — ED Notes (Signed)
Pt called x 2 

## 2011-04-17 ENCOUNTER — Encounter (HOSPITAL_COMMUNITY): Payer: Self-pay

## 2011-04-17 ENCOUNTER — Emergency Department (INDEPENDENT_AMBULATORY_CARE_PROVIDER_SITE_OTHER)
Admission: EM | Admit: 2011-04-17 | Discharge: 2011-04-17 | Disposition: A | Discharge status: Home / Self Care | Payer: Self-pay | Source: Home / Self Care

## 2011-04-17 DIAGNOSIS — N390 Urinary tract infection, site not specified: Secondary | ICD-10-CM

## 2011-04-17 LAB — POCT URINALYSIS DIP (DEVICE)
Glucose, UA: 100 mg/dL — AB
Ketones, ur: 40 mg/dL — AB
Specific Gravity, Urine: >=1.03 (ref 1.005–1.030)

## 2011-04-17 LAB — POCT PREGNANCY, URINE: Preg Test, Ur: NEGATIVE

## 2011-04-17 MED ORDER — CEFTRIAXONE SODIUM 1 G IJ SOLR: 1.0000 g | Freq: Once | INTRAMUSCULAR | Status: DC

## 2011-04-17 MED ORDER — CIPROFLOXACIN HCL 500 MG PO TABS: 500.0000 mg | ORAL_TABLET | Freq: Two times a day (BID) | ORAL | Status: AC

## 2011-04-17 MED ORDER — PHENAZOPYRIDINE HCL 200 MG PO TABS: 200.0000 mg | ORAL_TABLET | Freq: Two times a day (BID) | ORAL | Status: AC | PRN

## 2011-04-17 MED ORDER — CEFTRIAXONE SODIUM 1 G IJ SOLR
INTRAMUSCULAR | Status: AC
  Filled 2011-04-17: qty 10

## 2011-04-17 MED ORDER — HYDROCODONE-ACETAMINOPHEN 5-500 MG PO TABS: 1.0000 | ORAL_TABLET | Freq: Four times a day (QID) | ORAL | Status: AC | PRN

## 2011-04-17 NOTE — ED Provider Notes (Signed)
History     CSN: [id number] Arrival date & time: 04/17/2011  7:15 PM   First MD Initiated Contact with Patient 04/17/11 1820      Chief Complaint  Patient presents with  . Abdominal Pain    3 weeks ago had urinary tract infection and was tx with antibitoics.  however the symptoms of adominal pain and bleeding in urine has not went away.      (Consider location/radiation/quality/duration/timing/severity/associated sxs/prior treatment) Patient is a 30 y.o. female presenting with dysuria. The history is provided by the patient.  Dysuria  This is a recurrent problem. The current episode started 2 days ago. The problem occurs every urination. The problem has not changed since onset.The quality of the pain is described as burning (burning with urination urgency and constant suprapubic pain). The pain is at a severity of 6/10. The pain is moderate. There has been no fever. She is sexually active. There is no history of pyelonephritis. Associated symptoms include frequency, hematuria, hesitancy and urgency. Pertinent negatives include no chills, no nausea, no vomiting, no discharge, no possible pregnancy and no flank pain. She has tried antibiotics (was treated with keflex 3weeks ago with improvement.) for the symptoms. Her past medical history is significant for recurrent UTIs. Her past medical history does not include kidney stones or single kidney.    Past Medical History  Diagnosis Date  . Chronic headaches   . Migraines   . UTI (lower urinary tract infection)   . ITP (idiopathic thrombocytopenic purpura)   . Genital herpes   . Allergy   . Depression   . Heart murmur   . Sciatica of right side 11/28/2010    History reviewed. No pertinent past surgical history.  Family History  Problem Relation Age of Onset  . Hypertension Other   . Stroke Other   . Mental illness Other   . Cancer Other     colon cance and lung cancer  . Heart disease Other   . Mental illness Other   . Mental  illness Other     History  Substance Use Topics  . Smoking status: Former Games developer  . Smokeless tobacco: Not on file  . Alcohol Use: No    OB History    Grav Para Term Preterm Abortions TAB SAB Ect Mult Living                  Review of Systems  Constitutional: Negative for chills.  Gastrointestinal: Negative for nausea and vomiting.  Genitourinary: Positive for dysuria, hesitancy, urgency, frequency and hematuria. Negative for flank pain.  Neurological: Negative for dizziness and headaches.    Allergies  Review of patient's allergies indicates no known allergies.  Home Medications   Current Outpatient Rx  Name Route Sig Dispense Refill  . CIPROFLOXACIN HCL 500 MG PO TABS Oral Take 1 tablet (500 mg total) by mouth every 12 (twelve) hours. 10 tablet 0  . HYDROCODONE-ACETAMINOPHEN 5-500 MG PO TABS Oral Take 1-2 tablets by mouth every 6 (six) hours as needed for pain. 10 tablet 0  . PAROXETINE HCL 20 MG PO TABS Oral Take 1 tablet (20 mg total) by mouth daily. 30 tablet 11  . PHENAZOPYRIDINE HCL 200 MG PO TABS Oral Take 1 tablet (200 mg total) by mouth 2 (two) times daily as needed for pain. 4 tablet 0    BP 122/79  Pulse 85  Temp(Src) 97.8 F (36.6 C) (Oral)  Resp 14  SpO2 100%  LMP 03/27/2011  Physical Exam  Nursing note and vitals reviewed. Constitutional: She is oriented to person, place, and time. She appears well-developed and well-nourished. No distress.  HENT:  Mouth/Throat: Oropharynx is clear and moist.  Cardiovascular: Normal heart sounds.   Pulmonary/Chest: Breath sounds normal.  Abdominal: Soft. She exhibits no distension. There is no rebound and no guarding.       Suprapubic tenderness to deep palpation No CVT  Neurological: She is alert and oriented to person, place, and time.  Skin: No rash noted.    ED Course  Procedures (including critical care time)  Labs Reviewed  POCT URINALYSIS DIP (DEVICE) - Abnormal; Notable for the following:     Glucose, UA 100 (*)    Bilirubin Urine SMALL (*)    Ketones, ur 40 (*)    Hgb urine dipstick LARGE (*)    Protein, ur >=300 (*)    Leukocytes, UA MODERATE (*) Biochemical Testing Only. Please order routine urinalysis from main lab if confirmatory testing is needed.   All other components within normal limits  POCT PREGNANCY, URINE  URINE CULTURE   No results found.   1. UTI (lower urinary tract infection)       MDM  H/o recurrent UTI no culture in records. Recent Tx. With keflex. Treated with rocephin 1g IM to continue cipro for 5 more days. Urine culture pending recommend urology evaluation if persistent recurrences.        Sharin Grave, MD 04/19/11 [phone]

## 2011-04-17 NOTE — ED Notes (Signed)
3 weeks ago had urinary tract infection and was tx with antibitoics.  however the symptoms of adominal pain and bleeding in urine has not went away.

## 2011-04-18 LAB — URINE CULTURE
Colony Count: 50000
Culture  Setup Time: 201211262156

## 2011-04-19 NOTE — ED Notes (Signed)
Urine culture reviewed- mult. Bacterial types present, none predominant.  No further action needed. Vassie Moselle 04/19/2011

## 2011-05-01 ENCOUNTER — Telehealth: Payer: Self-pay

## 2011-05-01 NOTE — Telephone Encounter (Signed)
A user error has taken place: encounter opened in error, closed for administrative reasons.

## 2011-05-02 ENCOUNTER — Emergency Department (HOSPITAL_COMMUNITY)
Admission: EM | Admit: 2011-05-02 | Discharge: 2011-05-02 | Disposition: A | Discharge status: Home / Self Care | Payer: Self-pay | Attending: Emergency Medicine | Admitting: Emergency Medicine

## 2011-05-02 ENCOUNTER — Telehealth (HOSPITAL_COMMUNITY): Payer: Self-pay | Admitting: *Deleted

## 2011-05-02 ENCOUNTER — Encounter (HOSPITAL_COMMUNITY): Payer: Self-pay | Admitting: Adult Health

## 2011-05-02 DIAGNOSIS — K029 Dental caries, unspecified: Secondary | ICD-10-CM | POA: Insufficient documentation

## 2011-05-02 DIAGNOSIS — K089 Disorder of teeth and supporting structures, unspecified: Secondary | ICD-10-CM | POA: Insufficient documentation

## 2011-05-02 NOTE — ED Notes (Signed)
C/o of toothache for the last few months, pain began to radiate to lower right jaw. Multiple dental caries noted.

## 2011-05-02 NOTE — ED Provider Notes (Signed)
History     CSN: [id number] Arrival date & time: 05/02/2011  2:14 AM   First MD Initiated Contact with Patient 05/02/11 0234      Chief Complaint  Patient presents with  . Dental Injury    (Consider location/radiation/quality/duration/timing/severity/associated sxs/prior treatment) HPI Comments: Samantha Swanson several teeth cavities in the upper and lower molars.  Does not have dental insurance has tried some over-the-counter Tylenol.  Has not tried to contact Dr. due to finances , here for referral to dentist  Patient is a 30 y.o. female presenting with dental injury. The history is provided by the patient.  Dental Injury This is a chronic problem. The current episode started more than 1 year ago. The problem occurs constantly. The problem has been unchanged. Pertinent negatives include no sore throat. The symptoms are aggravated by drinking and eating. She has tried nothing for the symptoms.    Past Medical History  Diagnosis Date  . Chronic headaches   . Migraines   . UTI (lower urinary tract infection)   . ITP (idiopathic thrombocytopenic purpura)   . Genital herpes   . Allergy   . Depression   . Heart murmur   . Sciatica of right side 11/28/2010    History reviewed. No pertinent past surgical history.  Family History  Problem Relation Age of Onset  . Hypertension Other   . Stroke Other   . Mental illness Other   . Cancer Other     colon cance and lung cancer  . Heart disease Other   . Mental illness Other   . Mental illness Other     History  Substance Use Topics  . Smoking status: Former Games developer  . Smokeless tobacco: Not on file  . Alcohol Use: No    OB History    Grav Para Term Preterm Abortions TAB SAB Ect Mult Living                  Review of Systems  Constitutional: Negative.   HENT: Positive for dental problem. Negative for ear pain, sore throat, mouth sores and trouble swallowing.   Eyes: Negative.   Respiratory: Negative.   Cardiovascular:  Negative.   Gastrointestinal: Negative.   Genitourinary: Negative.   Musculoskeletal: Negative.   Neurological: Negative.   Hematological: Negative.   Psychiatric/Behavioral: Negative.     Allergies  Review of patient's allergies indicates no known allergies.  Home Medications   Current Outpatient Rx  Name Route Sig Dispense Refill  . PAROXETINE HCL 20 MG PO TABS Oral Take 1 tablet (20 mg total) by mouth daily. 30 tablet 11    BP 115/68  Pulse 108  Temp 98.3 F (36.8 C)  Resp 20  SpO2 100%  LMP 03/27/2011  Physical Exam  Constitutional: She is oriented to person, place, and time. She appears well-developed.  HENT:  Head: Normocephalic.  Mouth/Throat: Dental caries present.       Multiple dental caries, upper and lower molars, right and left side without surrounding gum swelling  Eyes: Pupils are equal, round, and reactive to light.  Neck: Normal range of motion.  Cardiovascular: Normal rate.   Pulmonary/Chest: Effort normal.  Abdominal: Soft.  Musculoskeletal: Normal range of motion.  Neurological: She is oriented to person, place, and time.  Skin: Skin is warm.  Psychiatric: She has a normal mood and affect.    ED Course  Procedures (including critical care time)  Labs Reviewed - No data to display No results found.   1. Dental  caries       MDM  Dental caries.  Will recommend using dental X. with Anbesol for symptom relief.  Will refer to dentist on call        Arman Filter, NP 05/02/11 0241  Arman Filter, NP 05/02/11 [phone]

## 2011-05-02 NOTE — ED Provider Notes (Signed)
Medical screening examination/treatment/procedure(s) were performed by non-physician practitioner and as supervising physician I was immediately available for consultation/collaboration.   Pierre Cumpton M Thais Silberstein, MD 05/02/11 0658 

## 2011-05-03 ENCOUNTER — Other Ambulatory Visit: Payer: Self-pay | Admitting: Internal Medicine

## 2011-05-03 DIAGNOSIS — D693 Immune thrombocytopenic purpura: Secondary | ICD-10-CM

## 2011-05-04 ENCOUNTER — Telehealth: Payer: Self-pay

## 2011-05-04 ENCOUNTER — Other Ambulatory Visit (INDEPENDENT_AMBULATORY_CARE_PROVIDER_SITE_OTHER): Payer: Self-pay

## 2011-05-04 DIAGNOSIS — D693 Immune thrombocytopenic purpura: Secondary | ICD-10-CM

## 2011-05-04 LAB — CBC WITH DIFFERENTIAL/PLATELET
Basophils Absolute: 0 10*3/uL (ref 0.0–0.1)
Basophils Relative: 0.8 % (ref 0.0–3.0)
Eosinophils Absolute: 0.2 10*3/uL (ref 0.0–0.7)
Lymphocytes Relative: 49.3 % — ABNORMAL HIGH (ref 12.0–46.0)
MCHC: 33.9 g/dL (ref 30.0–36.0)
MCV: 90.1 fl (ref 78.0–100.0)
Monocytes Absolute: 0.7 10*3/uL (ref 0.1–1.0)
Neutrophils Relative %: 30 % — ABNORMAL LOW (ref 43.0–77.0)
RBC: 4.25 Mil/uL (ref 3.87–5.11)
RDW: 13.4 % (ref 11.5–14.6)

## 2011-05-04 NOTE — Telephone Encounter (Signed)
Patient informed and is on her way to our lab asap. Will fax results to Dr. Mayford Knife once completed and signed off by [facility].

## 2011-05-04 NOTE — Telephone Encounter (Signed)
Called left message to call back 

## 2011-05-04 NOTE — Telephone Encounter (Signed)
Cbc is clearly ordered in the EMR  I do not know what the problem was, unless the particular lab she went to did access the information correctly  Please ask pt to come to our lab if necessary  The order was ordered stat, in order to get the information to Dr Betsy Pries (phone [phone], fax [phone])  Zella Ball to fax results when available

## 2011-05-04 NOTE — Telephone Encounter (Signed)
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 She went to the lab yesterday 05/03/2011 and there was no order. Please advise as patient cannot do dental work until labs are completed.

## 2011-05-04 NOTE — Telephone Encounter (Signed)
Results are complete and faxed with JWJ note to [phone] and called Dr. Norris Cross office at [phone] informed Drinda Butts results have been faxed.

## 2011-06-22 ENCOUNTER — Encounter (HOSPITAL_COMMUNITY): Payer: Self-pay | Admitting: *Deleted

## 2011-06-22 ENCOUNTER — Inpatient Hospital Stay (HOSPITAL_COMMUNITY)
Admission: AD | Admit: 2011-06-22 | Discharge: 2011-06-22 | Disposition: A | Discharge status: Home / Self Care | Payer: Self-pay | Source: Ambulatory Visit | Attending: Obstetrics and Gynecology | Admitting: Obstetrics and Gynecology

## 2011-06-22 DIAGNOSIS — N760 Acute vaginitis: Secondary | ICD-10-CM

## 2011-06-22 DIAGNOSIS — R109 Unspecified abdominal pain: Secondary | ICD-10-CM | POA: Insufficient documentation

## 2011-06-22 DIAGNOSIS — R102 Pelvic and perineal pain: Secondary | ICD-10-CM

## 2011-06-22 DIAGNOSIS — N949 Unspecified condition associated with female genital organs and menstrual cycle: Secondary | ICD-10-CM

## 2011-06-22 DIAGNOSIS — B9689 Other specified bacterial agents as the cause of diseases classified elsewhere: Secondary | ICD-10-CM

## 2011-06-22 DIAGNOSIS — A499 Bacterial infection, unspecified: Secondary | ICD-10-CM

## 2011-06-22 HISTORY — DX: Unspecified ovarian cyst, unspecified side: N83.209

## 2011-06-22 HISTORY — DX: Unspecified abnormal cytological findings in specimens from cervix uteri: R87.619

## 2011-06-22 HISTORY — DX: Reserved for concepts with insufficient information to code with codable children: IMO0002

## 2011-06-22 LAB — WET PREP, GENITAL: Trich, Wet Prep: NONE SEEN

## 2011-06-22 LAB — DIFFERENTIAL
Basophils Absolute: 0 10*3/uL (ref 0.0–0.1)
Eosinophils Absolute: 0.2 10*3/uL (ref 0.0–0.7)
Lymphocytes Relative: 42 % (ref 12–46)
Lymphs Abs: 2 10*3/uL (ref 0.7–4.0)
Monocytes Relative: 8 % (ref 3–12)
Neutrophils Relative %: 45 % (ref 43–77)

## 2011-06-22 LAB — URINALYSIS, ROUTINE W REFLEX MICROSCOPIC
Bilirubin Urine: NEGATIVE
Hgb urine dipstick: NEGATIVE
Nitrite: NEGATIVE
Protein, ur: NEGATIVE mg/dL
Specific Gravity, Urine: 1.025 (ref 1.005–1.030)
Urobilinogen, UA: 0.2 mg/dL (ref 0.0–1.0)

## 2011-06-22 LAB — CBC
Hemoglobin: 13.4 g/dL (ref 12.0–15.0)
Platelets: 128 10*3/uL — ABNORMAL LOW (ref 150–400)
RBC: 4.47 MIL/uL (ref 3.87–5.11)
RDW: 12.6 % (ref 11.5–15.5)
WBC: 4.8 10*3/uL (ref 4.0–10.5)

## 2011-06-22 LAB — POCT PREGNANCY, URINE: Preg Test, Ur: NEGATIVE

## 2011-06-22 MED ORDER — METRONIDAZOLE 500 MG PO TABS: 500.0000 mg | ORAL_TABLET | Freq: Two times a day (BID) | ORAL | Status: AC

## 2011-06-22 MED ORDER — AZITHROMYCIN 250 MG PO TABS
1000.0000 mg | ORAL_TABLET | Freq: Once | ORAL | Status: AC
  Administered 2011-06-22: 1000 mg via ORAL
  Filled 2011-06-22: qty 4

## 2011-06-22 MED ORDER — FLUCONAZOLE 150 MG PO TABS
150.0000 mg | ORAL_TABLET | Freq: Once | ORAL | Status: AC
  Administered 2011-06-22: 150 mg via ORAL
  Filled 2011-06-22: qty 1

## 2011-06-22 MED ORDER — CEFTRIAXONE SODIUM 250 MG IJ SOLR
250.0000 mg | Freq: Once | INTRAMUSCULAR | Status: AC
  Administered 2011-06-22: 250 mg via INTRAMUSCULAR
  Filled 2011-06-22: qty 250

## 2011-06-22 NOTE — Progress Notes (Signed)
Recurrent UTI, goes back and forth to urgent care. meds dont' seem to be working- still has odor and discharge- since Oct.  Pelvic pain past 4 days.  Recurrent BV since first started having sex, and pain with intercourse.

## 2011-06-22 NOTE — ED Notes (Signed)
Tolerated meds.

## 2011-06-22 NOTE — Progress Notes (Signed)
Patient states she has a history of recurrent bacterial and urinary tract infections. Has been treated multiple times since October 2012. Has been having abdominal pain, bad 4 days ago and no pain at this time. Has a vaginal discharge with a foul odor.

## 2011-06-22 NOTE — ED Provider Notes (Signed)
History     CSN: [id number]  Arrival date & time 06/22/11  1109   None     Chief Complaint  Patient presents with  . Abdominal Pain    (Samantha Swanson is a 31 y.o. female who presents to MAU for vaginal discharge and odor. The symptoms have been ongoing since October, 2012. She has been to Urgent Care for treatment of UTI and bacterial vaginosis several times. Patient continues to have UTI symptoms, pelvic pain and vaginal discharge with odor. Nausea last week but no vomiting. Current sex partner x 8 years, history of HSV, trichomonas, GC and Chlamydia. Unsure of last pap smear. Patient was evaluated here in May, 2012 and had a normal ultrasound. The history was provided by the patient.  Past Medical History  Diagnosis Date  . Chronic headaches   . Migraines   . UTI (lower urinary tract infection)   . ITP (idiopathic thrombocytopenic purpura)   . Genital herpes   . Allergy   . Heart murmur   . Sciatica of right side 11/28/2010  . Depression     not on meds, ok now  . Ovarian cyst   . Abnormal Pap smear     Past Surgical History  Procedure Date  . Induced abortion     x2    Family History  Problem Relation Age of Onset  . Hypertension Other   . Stroke Other   . Mental illness Other   . Cancer Other     colon cance and lung cancer  . Heart disease Other   . Mental illness Other   . Mental illness Other   . Anesthesia problems Neg Hx     History  Substance Use Topics  . Smoking status: Current Everyday Smoker -- 0.5 packs/day for 10 years    Types: Cigarettes  . Smokeless tobacco: Never Used  . Alcohol Use: No    OB History    Grav Para Term Preterm Abortions TAB SAB Ect Mult Living   4 1 0 1 3 2 1 0 0 1       Review of Systems  Constitutional: Negative for fever, chills, diaphoresis and fatigue.  HENT: Negative for ear pain, congestion, sore throat, facial swelling, neck pain, neck stiffness, dental problem and sinus pressure.   Eyes: Negative for  photophobia, pain and discharge.  Respiratory: Negative for cough, chest tightness and wheezing.   Cardiovascular: Negative.   Gastrointestinal: Positive for abdominal pain. Negative for nausea, vomiting, diarrhea, constipation and abdominal distention.  Genitourinary: Positive for urgency, frequency, vaginal discharge and pelvic pain. Negative for dysuria, flank pain, vaginal bleeding and difficulty urinating.  Musculoskeletal: Negative for myalgias, back pain and gait problem.  Skin: Negative for color change and rash.  Neurological: Positive for light-headedness. Negative for dizziness, speech difficulty, weakness, numbness and headaches.  Psychiatric/Behavioral: Negative for confusion and agitation. The patient is nervous/anxious.        Depression    Allergies  Review of patient's allergies indicates no known allergies.  Home Medications  No current outpatient prescriptions on file.  BP 109/83  Pulse 97  Temp(Src) 98.4 F (36.9 C) (Oral)  Resp 18  Ht 5\' 4"  (1.626 m)  Wt 153 lb 12.8 oz (69.763 kg)  BMI 26.40 kg/m2  SpO2 98%  LMP 06/07/2011  Physical Exam  Nursing note and vitals reviewed. Constitutional: She is oriented to person, place, and time. She appears well-developed and well-nourished.  HENT:  Head: Normocephalic.  Eyes: EOM are  normal.  Neck: Neck supple.  Cardiovascular: Normal rate.   Pulmonary/Chest: Effort normal.  Abdominal: Soft. There is tenderness (lower abdomen without rebound or guarding).  Genitourinary:       External genitalia without lesions. Frothy malodorous discharge vaginal vault. Cervix inflamed, positive CMT, bilateral adnexal tenderness. Uterus without palpable enlargement.  Musculoskeletal: Normal range of motion.  Neurological: She is alert and oriented to person, place, and time. No cranial nerve deficit.  Skin: Skin is warm and dry.  Psychiatric: She has a normal mood and affect. Her behavior is normal. Judgment and thought content  normal.   Results for orders placed during the hospital encounter of 06/22/11 (from the past 24 hour(s))  URINALYSIS, ROUTINE W REFLEX MICROSCOPIC     Status: Normal   Collection Time   06/22/11 12:00 PM      Component Value Range   Color, Urine YELLOW  YELLOW    APPearance CLEAR  CLEAR    Specific Gravity, Urine 1.025  1.005 - 1.030    pH 6.0  5.0 - 8.0    Glucose, UA NEGATIVE  NEGATIVE (mg/dL)   Hgb urine dipstick NEGATIVE  NEGATIVE    Bilirubin Urine NEGATIVE  NEGATIVE    Ketones, ur NEGATIVE  NEGATIVE (mg/dL)   Protein, ur NEGATIVE  NEGATIVE (mg/dL)   Urobilinogen, UA 0.2  0.0 - 1.0 (mg/dL)   Nitrite NEGATIVE  NEGATIVE    Leukocytes, UA NEGATIVE  NEGATIVE   POCT PREGNANCY, URINE     Status: Normal   Collection Time   06/22/11 12:07 PM      Component Value Range   Preg Test, Ur NEGATIVE  NEGATIVE   CBC     Status: Abnormal   Collection Time   06/22/11  1:06 PM      Component Value Range   WBC 4.8  4.0 - 10.5 (K/uL)   RBC 4.47  3.87 - 5.11 (MIL/uL)   Hemoglobin 13.4  12.0 - 15.0 (g/dL)   HCT 96.2  95.2 - 84.1 (%)   MCV 87.0  78.0 - 100.0 (fL)   MCH 30.0  26.0 - 34.0 (pg)   MCHC 34.4  30.0 - 36.0 (g/dL)   RDW 32.4  40.1 - 02.7 (%)   Platelets 128 (*) 150 - 400 (K/uL)  DIFFERENTIAL     Status: Normal   Collection Time   06/22/11  1:06 PM      Component Value Range   Neutrophils Relative 45  43 - 77 (%)   Neutro Abs 2.2  1.7 - 7.7 (K/uL)   Lymphocytes Relative 42  12 - 46 (%)   Lymphs Abs 2.0  0.7 - 4.0 (K/uL)   Monocytes Relative 8  3 - 12 (%)   Monocytes Absolute 0.4  0.1 - 1.0 (K/uL)   Eosinophils Relative 4  0 - 5 (%)   Eosinophils Absolute 0.2  0.0 - 0.7 (K/uL)   Basophils Relative 0  0 - 1 (%)   Basophils Absolute 0.0  0.0 - 0.1 (K/uL)  WET PREP, GENITAL     Status: Abnormal   Collection Time   06/22/11  1:20 PM      Component Value Range   Yeast Wet Prep HPF POC NONE SEEN  NONE SEEN    Trich, Wet Prep NONE SEEN  NONE SEEN    Clue Cells Wet Prep HPF POC  MODERATE (*) NONE SEEN    WBC, Wet Prep HPF POC FEW (*) NONE SEEN    Assessment: Bacterial  vaginosis   Pelvic pain  Plan:  Rocephin 250 mg. IM   Zithromax 1 gram po   Rx Flagyl   Discussed with patient need for partner to be treated for BV due to her recurrence of BV over the past several    Months.   Follow up in the [facility] Clinic, return here as needed. ED Course  Procedures   MDM          Kerrie Buffalo, NP 06/22/11 1407

## 2011-06-23 NOTE — ED Provider Notes (Signed)
Agree with above note.  Samantha Swanson 06/23/2011 5:52 AM

## 2012-01-17 ENCOUNTER — Encounter (HOSPITAL_COMMUNITY): Payer: Self-pay | Admitting: *Deleted

## 2012-01-17 ENCOUNTER — Inpatient Hospital Stay (HOSPITAL_COMMUNITY)
Admission: AD | Admit: 2012-01-17 | Discharge: 2012-01-17 | Disposition: A | Discharge status: Home / Self Care | Payer: Self-pay | Source: Ambulatory Visit | Attending: Obstetrics & Gynecology | Admitting: Obstetrics & Gynecology

## 2012-01-17 DIAGNOSIS — N898 Other specified noninflammatory disorders of vagina: Secondary | ICD-10-CM

## 2012-01-17 DIAGNOSIS — N949 Unspecified condition associated with female genital organs and menstrual cycle: Secondary | ICD-10-CM | POA: Insufficient documentation

## 2012-01-17 HISTORY — DX: Thrombocytopenia, unspecified: D69.6

## 2012-01-17 LAB — URINALYSIS, ROUTINE W REFLEX MICROSCOPIC
Glucose, UA: NEGATIVE mg/dL
Leukocytes, UA: NEGATIVE
Protein, ur: NEGATIVE mg/dL
Specific Gravity, Urine: 1.02 (ref 1.005–1.030)
pH: 6 (ref 5.0–8.0)

## 2012-01-17 LAB — WET PREP, GENITAL

## 2012-01-17 LAB — POCT PREGNANCY, URINE: Preg Test, Ur: NEGATIVE

## 2012-01-17 NOTE — MAU Provider Note (Signed)
History    HPI: Patient is a 31 year old [id number] female with a history of numerous STDs contracted from her partner who she believes has other partners. 10 days ago she began of cottage cheese like, puritic, "trashy smelling" discharge that she believes is trichomonas.  She has also been having subjective fevers, mild 3/10 bilateral lower quadrant discomfort, nausea, diarrhea, increased migraine frequency  and constipation. Denies Vomiting, chest pain, sob. She does not use contraception/std protection.    CSN: [id number]   Arrival date and time: 01/17/12 1040    First Provider Initiated Contact with Patient 01/17/12 1135          Chief Complaint   Patient presents with   .  Pelvic Pain   .  Vaginal Discharge    Patient is a 31 y.o. female presenting with vaginal discharge.  Vaginal Discharge   Pertinent Gynecological History: Menses: LMP: Aug 11th-15th Bleeding: None Contraception: none DES exposure: unknown Sexually transmitted diseases: currently at risk and history of GC, CL, Trichomonas, Herpes Last pap: abnormal: UNknown Date: 1 year ago      Past Medical History   Diagnosis  Date   .  Chronic headaches     .  Migraines     .  UTI (lower urinary tract infection)     .  ITP (idiopathic thrombocytopenic purpura)     .  Genital herpes     .  Allergy     .  Heart murmur     .  Sciatica of right side  11/28/2010   .  Depression         not on meds, ok now   .  Ovarian cyst     .  Abnormal Pap smear     .  Thrombocytopenia         Past Surgical History   Procedure  Date   .  Induced abortion         x2       Family History   Problem  Relation  Age of Onset   .  Hypertension  Other     .  Stroke  Other     .  Mental illness  Other     .  Cancer  Other         colon cance and lung cancer   .  Heart disease  Other     .  Mental illness  Other     .  Mental illness  Other     .  Anesthesia problems  Neg Hx     .  Other  Neg Hx         History     Substance Use Topics   .  Smoking status:  Current Everyday Smoker -- 0.5 packs/day for 10 years       Types:  Cigarettes   .  Smokeless tobacco:  Never Used   .  Alcohol Use:  No      Allergies:  Allergies   Allergen  Reactions   .  Latex  Itching       burning       Prescriptions prior to admission   Medication  Sig  Dispense  Refill   .  diphenhydramine-acetaminophen (TYLENOL PM) 25-500 MG TABS  Take 2 tablets by mouth at bedtime as needed. For migraine         .  OVER THE COUNTER MEDICATION  Take 2 tablets by mouth every morning.  OTC, Herbal Supplement~B Pollen/Detox Cleanse         .  OxyCODONE HCl (OXYCONTIN PO)  Take 1 tablet by mouth once.            Review of Systems  Genitourinary: Positive for vaginal discharge.  See HPI for ROS Physical Exam      Blood pressure 112/67, pulse 95, temperature 97.9 F (36.6 C), temperature source Oral, resp. rate 16, height 5\' 5"  (1.651 m), weight 153 lb (69.4 kg), last menstrual period 12/31/2011.   Physical Exam  Constitutional: Vital signs are normal. She appears well-developed and well-nourished. No distress.  HENT:   Head: Normocephalic and atraumatic.  Right Ear: External ear normal.  Left Ear: External ear normal.  Eyes: Conjunctivae and EOM are normal. Pupils are equal, round, and reactive to light. Right eye exhibits no discharge. Left eye exhibits no discharge.  Cardiovascular: Normal rate, regular rhythm, S1 normal, S2 normal, intact distal pulses and normal pulses.  Exam reveals no gallop, no S3, no S4, no distant heart sounds and no friction rub.   Respiratory: Effort normal and breath sounds normal. No respiratory distress.  GI: Soft. Normal appearance. There is no tenderness. There is no rigidity, no rebound and no guarding.  Genitourinary: Pelvic exam was performed with patient supine. Vaginal discharge found.       Small amount of thin pale yellow discharge in vagina. Vaginal walls mildly erythematous.  No odor  detected. Cervix closed. No cervical motion tenderness.  Skin: She is not diaphoretic.     MAU Course    Procedures Results for orders placed during the hospital encounter of 01/17/12 (from the past 24 hour(s))  URINALYSIS, ROUTINE W REFLEX MICROSCOPIC     Status: Normal   Collection Time   01/17/12 11:00 AM      Component Value Range   Color, Urine YELLOW  YELLOW   APPearance CLEAR  CLEAR   Specific Gravity, Urine 1.020  1.005 - 1.030   pH 6.0  5.0 - 8.0   Glucose, UA NEGATIVE  NEGATIVE mg/dL   Hgb urine dipstick NEGATIVE  NEGATIVE   Bilirubin Urine NEGATIVE  NEGATIVE   Ketones, ur NEGATIVE  NEGATIVE mg/dL   Protein, ur NEGATIVE  NEGATIVE mg/dL   Urobilinogen, UA 0.2  0.0 - 1.0 mg/dL   Nitrite NEGATIVE  NEGATIVE   Leukocytes, UA NEGATIVE  NEGATIVE  POCT PREGNANCY, URINE     Status: Normal   Collection Time   01/17/12 11:04 AM      Component Value Range   Preg Test, Ur NEGATIVE  NEGATIVE  WET PREP, GENITAL     Status: Abnormal   Collection Time   01/17/12 12:15 PM      Component Value Range   Yeast Wet Prep HPF POC NONE SEEN  NONE SEEN   Trich, Wet Prep NONE SEEN  NONE SEEN   Clue Cells Wet Prep HPF POC NONE SEEN  NONE SEEN   WBC, Wet Prep HPF POC FEW (*) NONE SEEN    MDM GC/CL, Wetprep .     Assessment and Plan   No infection found today.  Plan Await culture results If no infection found, client to be rechecked in one week at [facility] clinic if symtoms do not resolve.   Doran Heater 01/17/2012, 12:22 PM

## 2012-01-17 NOTE — MAU Note (Signed)
?   Trich, desires to be tested;

## 2012-01-17 NOTE — MAU Provider Note (Signed)
Attestation of Attending Supervision of Advanced Practitioner (CNM/NP): Evaluation and management procedures were performed by the Advanced Practitioner under my supervision and collaboration.  I have reviewed the Advanced Practitioner's note and chart, and I agree with the management and plan.  UGONNA  ANYANWU, MD, FACOG Attending Obstetrician & Gynecologist Faculty Practice, [facility]  

## 2012-01-17 NOTE — ED Provider Notes (Cosign Needed)
History   HPI: Patient is a 31 year old G14p1021 female with a history of numerous STDs contracted from her partner who she believes has other partners. 10 days ago she began of cottage cheese like, puritic, "trashy smelling" discharge that she believes is trichomonas.  She has also been having subjective fevers, mild 3/10 bilateral lower quadrant discomfort, nausea, diarrhea, increased migraine frequency  and constipation. Denies Vomiting, chest pain, sob. She does not use contraception/std protection.   CSN: [id number]  Arrival date and time: 01/17/12 1040   First Provider Initiated Contact with Patient 01/17/12 1135      Chief Complaint  Patient presents with  . Pelvic Pain  . Vaginal Discharge   Patient is a 31 y.o. female presenting with vaginal discharge.  Vaginal Discharge    Pertinent Gynecological History: Menses: LMP: Aug 11th-15th Bleeding: None Contraception: none DES exposure: unknown Sexually transmitted diseases: currently at risk and history of GC, CL, Trichomonas, Herpes Last pap: abnormal: UNknown Date: 1 year ago   Past Medical History  Diagnosis Date  . Chronic headaches   . Migraines   . UTI (lower urinary tract infection)   . ITP (idiopathic thrombocytopenic purpura)   . Genital herpes   . Allergy   . Heart murmur   . Sciatica of right side 11/28/2010  . Depression     not on meds, ok now  . Ovarian cyst   . Abnormal Pap smear   . Thrombocytopenia     Past Surgical History  Procedure Date  . Induced abortion     x2    Family History  Problem Relation Age of Onset  . Hypertension Other   . Stroke Other   . Mental illness Other   . Cancer Other     colon cance and lung cancer  . Heart disease Other   . Mental illness Other   . Mental illness Other   . Anesthesia problems Neg Hx   . Other Neg Hx     History  Substance Use Topics  . Smoking status: Current Everyday Smoker -- 0.5 packs/day for 10 years    Types: Cigarettes  .  Smokeless tobacco: Never Used  . Alcohol Use: No    Allergies:  Allergies  Allergen Reactions  . Latex Itching    burning    Prescriptions prior to admission  Medication Sig Dispense Refill  . diphenhydramine-acetaminophen (TYLENOL PM) 25-500 MG TABS Take 2 tablets by mouth at bedtime as needed. For migraine      . OVER THE COUNTER MEDICATION Take 2 tablets by mouth every morning. OTC, Herbal Supplement~B Pollen/Detox Cleanse      . OxyCODONE HCl (OXYCONTIN PO) Take 1 tablet by mouth once.        Review of Systems  Genitourinary: Positive for vaginal discharge.  See HPI for ROS Physical Exam   Blood pressure 112/67, pulse 95, temperature 97.9 F (36.6 C), temperature source Oral, resp. rate 16, height 5\' 5"  (1.651 m), weight 153 lb (69.4 kg), last menstrual period 12/31/2011.  Physical Exam  Constitutional: Vital signs are normal. She appears well-developed and well-nourished. No distress.  HENT:  Head: Normocephalic and atraumatic.  Right Ear: External ear normal.  Left Ear: External ear normal.  Eyes: Conjunctivae and EOM are normal. Pupils are equal, round, and reactive to light. Right eye exhibits no discharge. Left eye exhibits no discharge.  Cardiovascular: Normal rate, regular rhythm, S1 normal, S2 normal, intact distal pulses and normal pulses.  Exam reveals no  gallop, no S3, no S4, no distant heart sounds and no friction rub.   Respiratory: Effort normal and breath sounds normal. No respiratory distress.  GI: Soft. Normal appearance. There is no tenderness. There is no rigidity, no rebound and no guarding.  Genitourinary: Pelvic exam was performed with patient supine. Vaginal discharge found.       Small amount of thick white discharge in vagina. Cervix closed. No cervical motion tenderness.  Skin: She is not diaphoretic.    MAU Course  Procedures  MDM GC/CL, Trich test.   Assessment and Plan   Samantha Swanson 01/17/2012, 12:22 PM

## 2012-01-17 NOTE — MAU Note (Signed)
On 08/10- thought cycle was early,- started bleeding on 08/11- 08/15;;intimate on 08/17.  Started having discharge an odor around that time. Hx of BV, doesn't smell or seem like that.  Has been nauseous, and had itching.

## 2012-01-18 LAB — GC/CHLAMYDIA PROBE AMP, GENITAL: Chlamydia, DNA Probe: NEGATIVE

## 2012-05-08 IMAGING — US US TRANSVAGINAL NON-OB
1 series · 14 of 25 positions shown · non-contrast
Comparison: None available.

CLINICAL DATA: Left lower quadrant pain.



[Series 1: us transvaginal non-ob · 53 acquisitions, 14 frames shown]
[im 1/53]
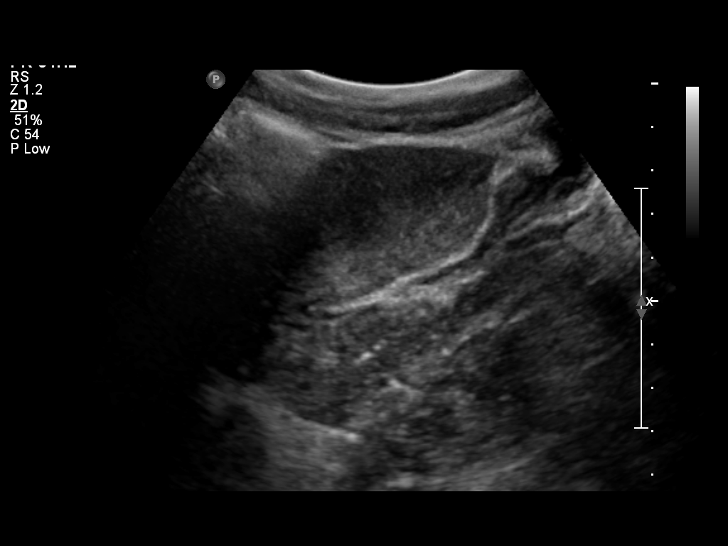
[im 5/53]
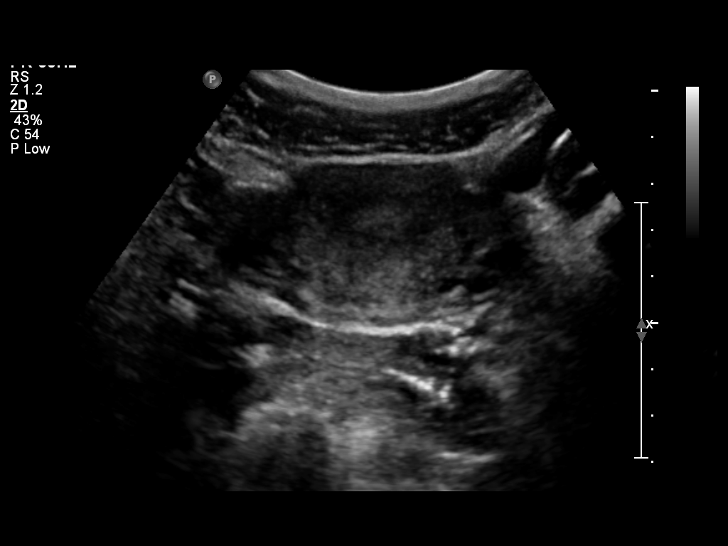
[im 9/53]
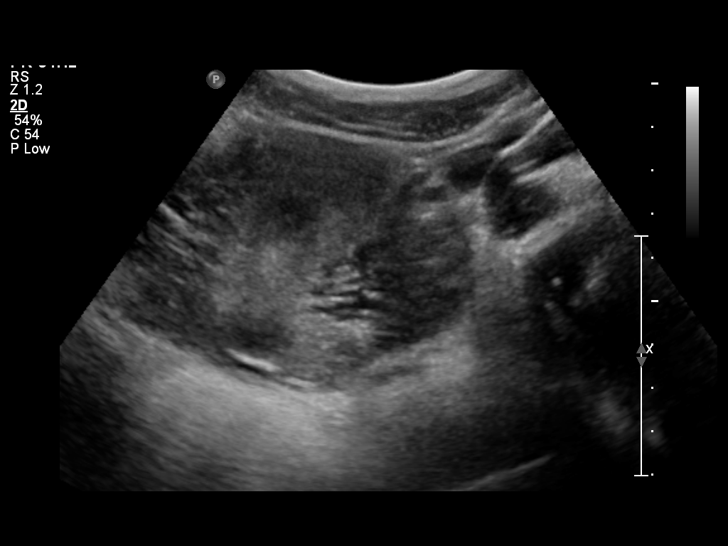
[im 14/53]
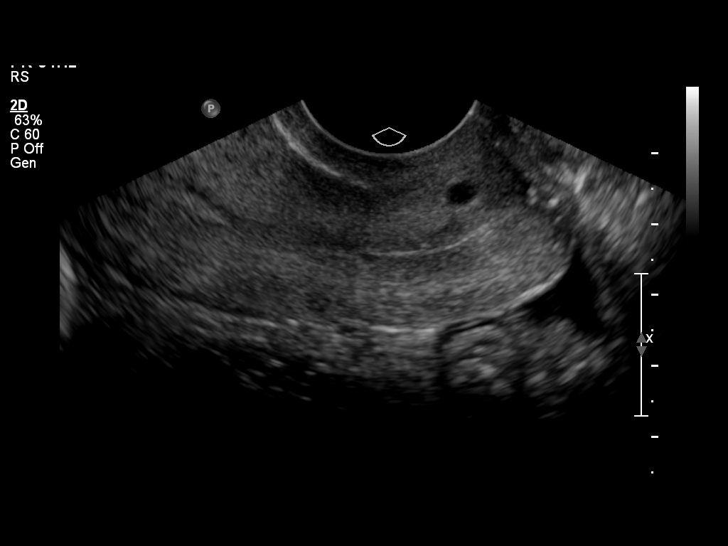
[im 18/53]
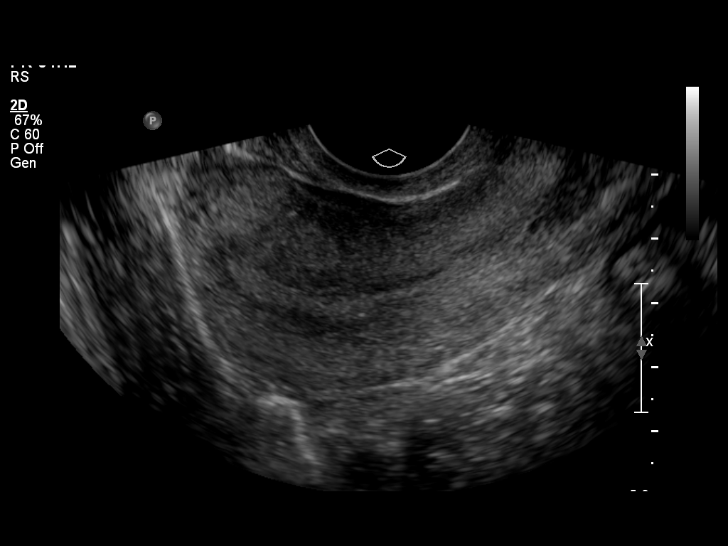
[im 20/53]
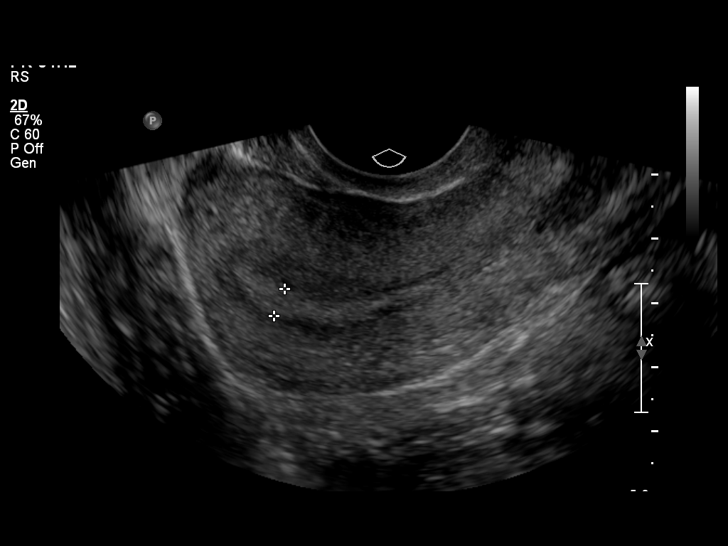
[im 24/53]
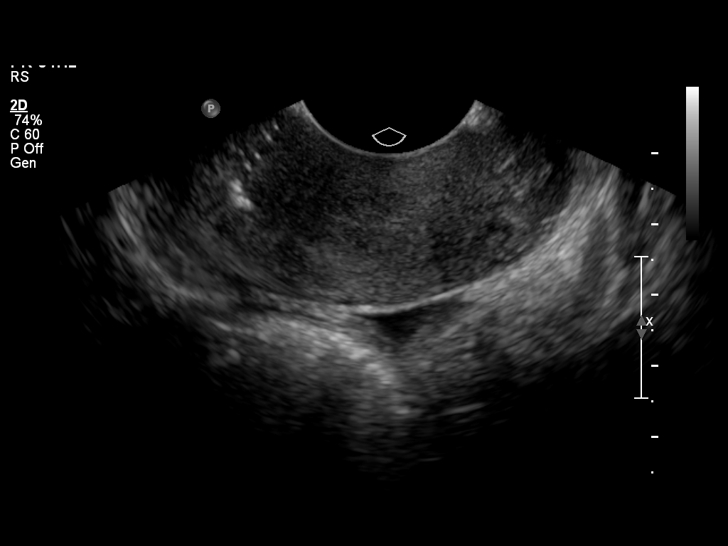
[im 29/53]
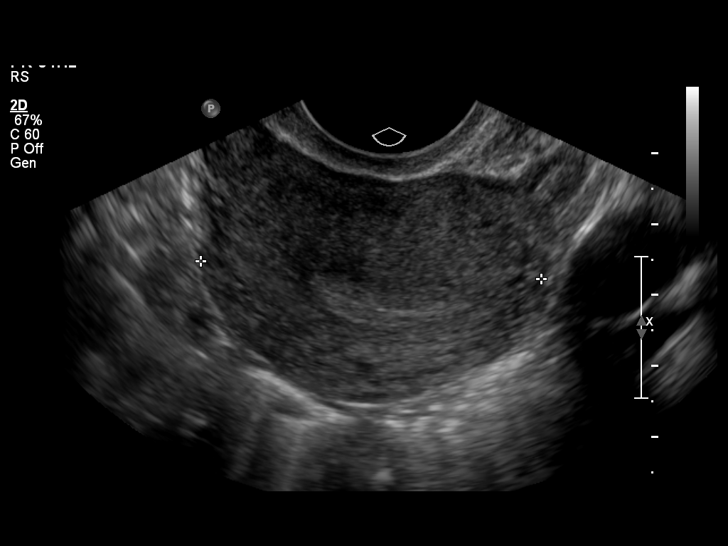
[im 33/53]
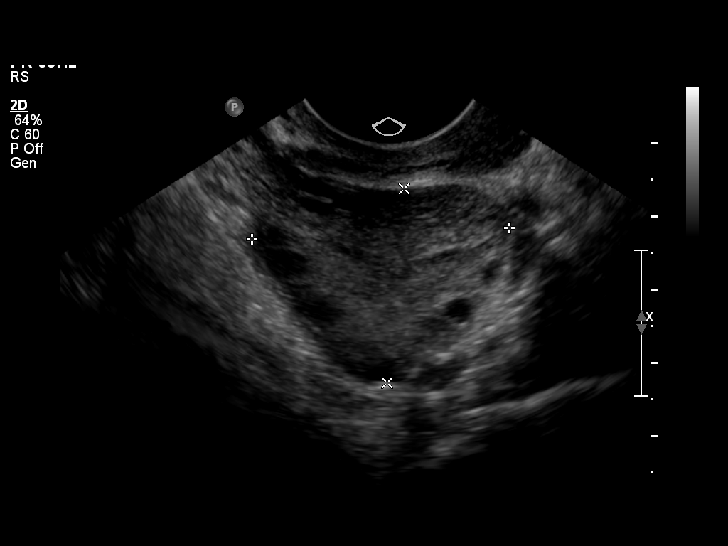
[im 35/53]
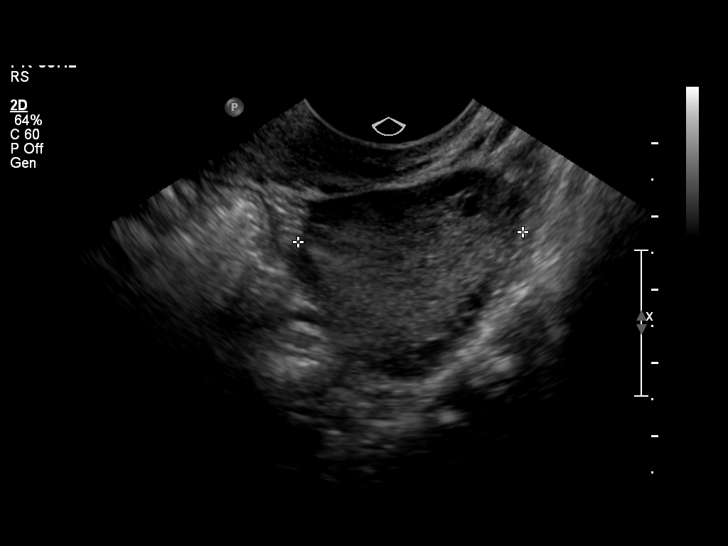
[im 40/53]
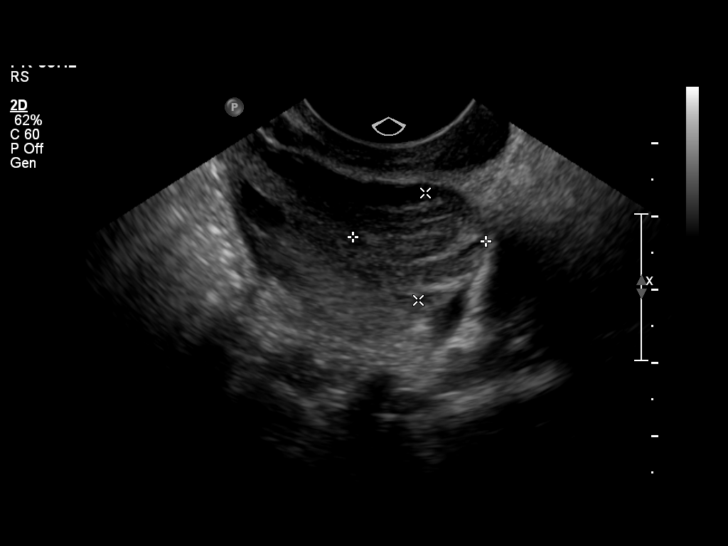
[im 44/53]
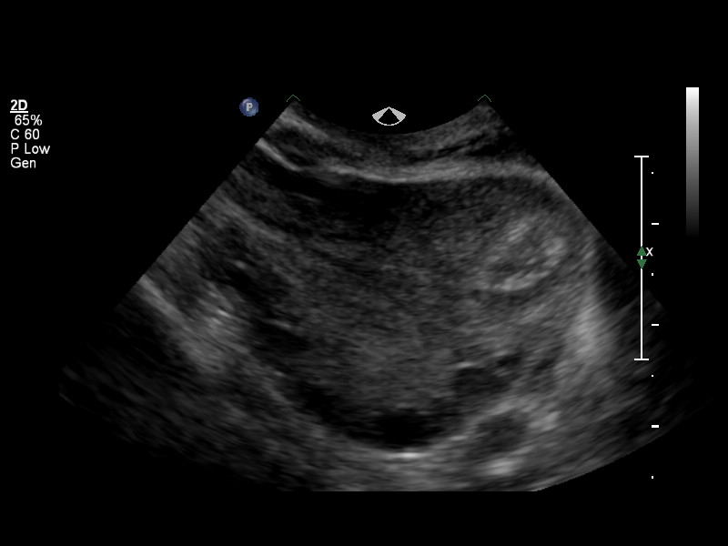
[im 48/53]
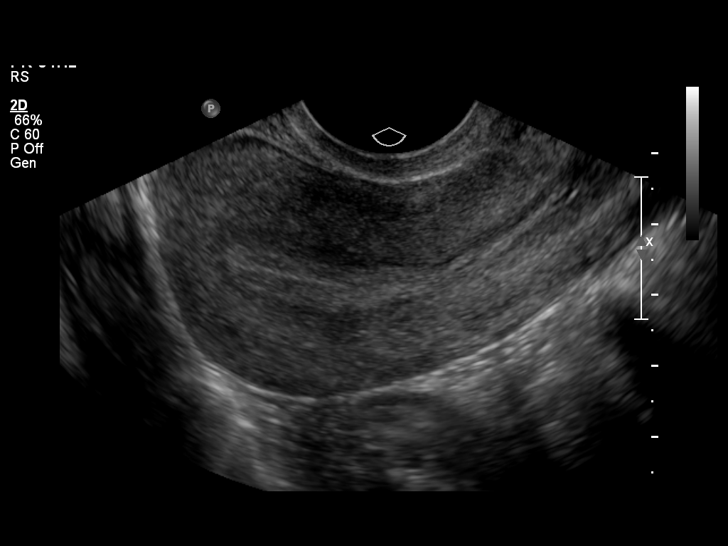
[im 53/53]
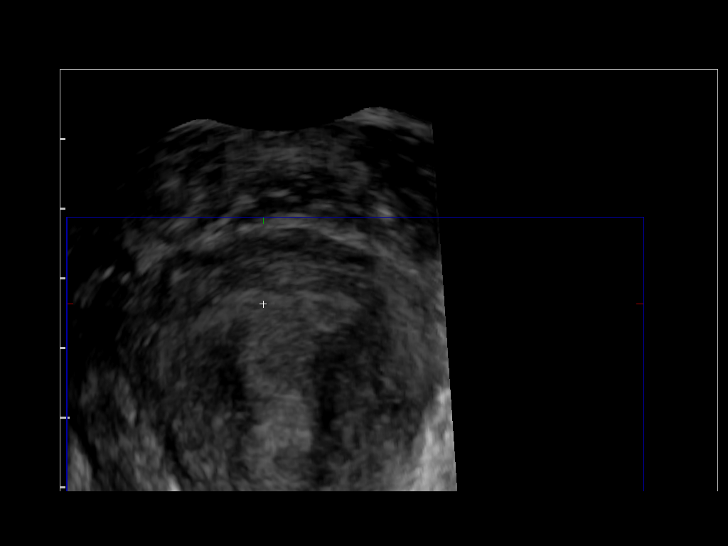

[14 of 25 positions shown; findings below may reference images not displayed]

FINDINGS: Uterus:  Measures 7.9 x 3.3 x 4.8 cm.  No fibroids or other uterine
masses identified.

Endometrium:  Measures 6.8 mm, within normal limits.

Right ovary:  Measures 3.8 x 2.0 x 2.7 cm.  Normal appearance/no
adnexal mass.

Left ovary:  Measures 3.5 x 2.7 x 3.1 cm in.  A focal heterogeneous
solid mass measures 1.8 x 1.5 x 1.7 cm.

Other findings:  No free fluid.
IMPRESSION: 1.  1.8 cm heterogeneous solid mass lesion of the left ovary is
nonspecific.  Recommend follow-up ultrasound at six or 10 weeks.
2.  Otherwise unremarkable pelvic ultrasound.

## 2012-05-13 ENCOUNTER — Inpatient Hospital Stay (HOSPITAL_COMMUNITY)
Admission: AD | Admit: 2012-05-13 | Discharge: 2012-05-13 | Disposition: A | Discharge status: Home / Self Care | Payer: Self-pay | Source: Ambulatory Visit | Attending: Family Medicine | Admitting: Family Medicine

## 2012-05-13 ENCOUNTER — Encounter (HOSPITAL_COMMUNITY): Payer: Self-pay | Admitting: *Deleted

## 2012-05-13 DIAGNOSIS — N760 Acute vaginitis: Secondary | ICD-10-CM | POA: Insufficient documentation

## 2012-05-13 DIAGNOSIS — B9689 Other specified bacterial agents as the cause of diseases classified elsewhere: Secondary | ICD-10-CM | POA: Insufficient documentation

## 2012-05-13 DIAGNOSIS — N949 Unspecified condition associated with female genital organs and menstrual cycle: Secondary | ICD-10-CM | POA: Insufficient documentation

## 2012-05-13 DIAGNOSIS — A499 Bacterial infection, unspecified: Secondary | ICD-10-CM | POA: Insufficient documentation

## 2012-05-13 LAB — URINALYSIS, ROUTINE W REFLEX MICROSCOPIC
Leukocytes, UA: NEGATIVE
Nitrite: NEGATIVE
Specific Gravity, Urine: 1.015 (ref 1.005–1.030)
pH: 6 (ref 5.0–8.0)

## 2012-05-13 LAB — WET PREP, GENITAL
Trich, Wet Prep: NONE SEEN
Yeast Wet Prep HPF POC: NONE SEEN

## 2012-05-13 MED ORDER — METRONIDAZOLE 500 MG PO TABS: 500.0000 mg | ORAL_TABLET | Freq: Two times a day (BID) | ORAL | Status: DC

## 2012-05-13 NOTE — MAU Provider Note (Signed)
History     CSN: [id number]  Arrival date and time: 05/13/12 7829   First Provider Initiated Contact with Patient 05/13/12 1954      Chief Complaint  Patient presents with  . Vaginal Discharge  . Pelvic Pain   HPI 31 y.o. [id number] with vaginal discharge and odor. H/O recurrent BV and multiple episodes of trich. Uses condoms irregularly.    Past Medical History  Diagnosis Date  . Chronic headaches   . Migraines   . UTI (lower urinary tract infection)   . ITP (idiopathic thrombocytopenic purpura)   . Genital herpes   . Allergy   . Heart murmur   . Sciatica of right side 11/28/2010  . Depression     not on meds, ok now  . Ovarian cyst   . Abnormal Pap smear   . Thrombocytopenia     Past Surgical History  Procedure Date  . Induced abortion     x2    Family History  Problem Relation Age of Onset  . Hypertension Other   . Stroke Other   . Mental illness Other   . Cancer Other     colon cance and lung cancer  . Heart disease Other   . Mental illness Other   . Mental illness Other   . Anesthesia problems Neg Hx   . Other Neg Hx     History  Substance Use Topics  . Smoking status: Current Every Day Smoker -- 0.5 packs/day for 10 years    Types: Cigarettes  . Smokeless tobacco: Never Used  . Alcohol Use: No    Allergies:  Allergies  Allergen Reactions  . Latex Itching    burning    Prescriptions prior to admission  Medication Sig Dispense Refill  . cholecalciferol (VITAMIN D) 1000 UNITS tablet Take 1,000 Units by mouth daily.      . fish oil-omega-3 fatty acids 1000 MG capsule Take 2 g by mouth daily.      . Flaxseed, Linseed, (FLAX PO) Take by mouth.      . Ginkgo Biloba 120 MG CAPS Take by mouth.      Marland Kitchen ibuprofen (ADVIL,MOTRIN) 200 MG tablet Take 800 mg by mouth every 6 (six) hours as needed.      Marland Kitchen OVER THE COUNTER MEDICATION Take 2 tablets by mouth every morning. OTC, Herbal Supplement~B Pollen/Detox Cleanse      . vitamin B-12 (CYANOCOBALAMIN)  500 MCG tablet Take 500 mcg by mouth daily.      . vitamin E 1000 UNIT capsule Take 1,000 Units by mouth daily.      . [DISCONTINUED] diphenhydramine-acetaminophen (TYLENOL PM) 25-500 MG TABS Take 2 tablets by mouth at bedtime as needed. For migraine      . [DISCONTINUED] OxyCODONE HCl (OXYCONTIN PO) Take 1 tablet by mouth once.        Review of Systems  Constitutional: Negative.   Respiratory: Negative.   Cardiovascular: Negative.   Gastrointestinal: Negative for nausea, vomiting, abdominal pain, diarrhea and constipation.  Genitourinary: Negative for dysuria, urgency, frequency, hematuria and flank pain.       Negative for vaginal bleeding, + vaginal discharge  Musculoskeletal: Negative.   Neurological: Negative.   Psychiatric/Behavioral: Negative.    Physical Exam   Blood pressure 126/83, pulse 82, temperature 98.1 F (36.7 C), temperature source Oral, resp. rate 18, height 5' 4.5" (1.638 m), weight 154 lb (69.854 kg), last menstrual period 05/05/2012.  Physical Exam  Nursing note and vitals reviewed. Constitutional: She is  oriented to person, place, and time. She appears well-developed and well-nourished. No distress.  Cardiovascular: Normal rate.   Respiratory: Effort normal.  GI: Soft. There is no tenderness.  Genitourinary: There is no rash, tenderness or lesion on the right labia. There is no rash, tenderness or lesion on the left labia. Cervix exhibits discharge (mucous). Cervix exhibits no friability. No bleeding around the vagina. Vaginal discharge (white, sl bubbly) found.  Musculoskeletal: Normal range of motion.  Neurological: She is alert and oriented to person, place, and time.  Skin: Skin is warm and dry.  Psychiatric: She has a normal mood and affect.    MAU Course  Procedures  Results for orders placed during the hospital encounter of 05/13/12 (from the past 24 hour(s))  URINALYSIS, ROUTINE W REFLEX MICROSCOPIC     Status: Normal   Collection Time    05/13/12  7:30 PM      Component Value Range   Color, Urine YELLOW  YELLOW   APPearance CLEAR  CLEAR   Specific Gravity, Urine 1.015  1.005 - 1.030   pH 6.0  5.0 - 8.0   Glucose, UA NEGATIVE  NEGATIVE mg/dL   Hgb urine dipstick NEGATIVE  NEGATIVE   Bilirubin Urine NEGATIVE  NEGATIVE   Ketones, ur NEGATIVE  NEGATIVE mg/dL   Protein, ur NEGATIVE  NEGATIVE mg/dL   Urobilinogen, UA 0.2  0.0 - 1.0 mg/dL   Nitrite NEGATIVE  NEGATIVE   Leukocytes, UA NEGATIVE  NEGATIVE  POCT PREGNANCY, URINE     Status: Normal   Collection Time   05/13/12  7:35 PM      Component Value Range   Preg Test, Ur NEGATIVE  NEGATIVE  WET PREP, GENITAL     Status: Abnormal   Collection Time   05/13/12  7:56 PM      Component Value Range   Yeast Wet Prep HPF POC NONE SEEN  NONE SEEN   Trich, Wet Prep NONE SEEN  NONE SEEN   Clue Cells Wet Prep HPF POC MODERATE (*) NONE SEEN   WBC, Wet Prep HPF POC FEW (*) NONE SEEN     Assessment and Plan   1. BV (bacterial vaginosis)       Medication List     As of 05/13/2012  8:16 PM    START taking these medications         metroNIDAZOLE 500 MG tablet   Commonly known as: FLAGYL   Take 1 tablet (500 mg total) by mouth 2 (two) times daily.      CONTINUE taking these medications         cholecalciferol 1000 UNITS tablet   Commonly known as: VITAMIN D      fish oil-omega-3 fatty acids 1000 MG capsule      FLAX PO      Ginkgo Biloba 120 MG Caps      ibuprofen 200 MG tablet   Commonly known as: ADVIL,MOTRIN      OVER THE COUNTER MEDICATION      vitamin B-12 500 MCG tablet   Commonly known as: CYANOCOBALAMIN      vitamin E 1000 UNIT capsule      STOP taking these medications         diphenhydramine-acetaminophen 25-500 MG Tabs   Commonly known as: TYLENOL PM      OXYCONTIN PO          Where to get your medications    These are the prescriptions that you need to pick  up. We sent them to a specific pharmacy, so you will need to go there to get  them.   [id number] - [place], [place] - [address] AT [facility] [address]    [address] [place] [address]    Phone: [phone]    Hours: 24-hours        metroNIDAZOLE 500 MG tablet            Follow-up Information    Follow up with [facility] [place]. (As needed)    Contact information:   [address] [place] [address] [phone]           Imoni Kohen 05/13/2012, 8:16 PM

## 2012-05-13 NOTE — MAU Note (Signed)
Here every couple months for d/c.  Thinks it smells like trich, has not had a new partner.  Low pelvic pain- started 12/14

## 2012-05-13 NOTE — MAU Provider Note (Signed)
Chart reviewed and agree with management and plan.  

## 2012-05-13 NOTE — Progress Notes (Signed)
Written and verbal d/c instructions given and understanding voiced. 

## 2012-05-25 IMAGING — US US TRANSVAGINAL NON-OB
1 series · 14 of 25 positions shown · non-contrast
Comparison: 08/02/2010

CLINICAL DATA: Abdominal and left upper quadrant pain.  LMP
09/07/2010

TRANSVAGINAL ULTRASOUND OF PELVIS
TECHNIQUE: Transvaginal ultrasound examination of the pelvis was
performed including evaluation of the uterus, ovaries, adnexal
regions, and pelvic cul-de-sac.

[Series 1: us transvaginal non-ob · 14 of 53 slices shown]
[im 1/53]
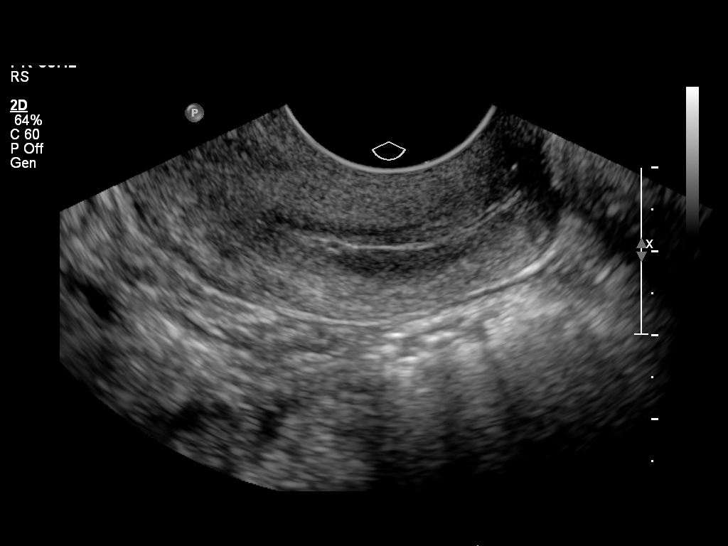
[im 5/53]
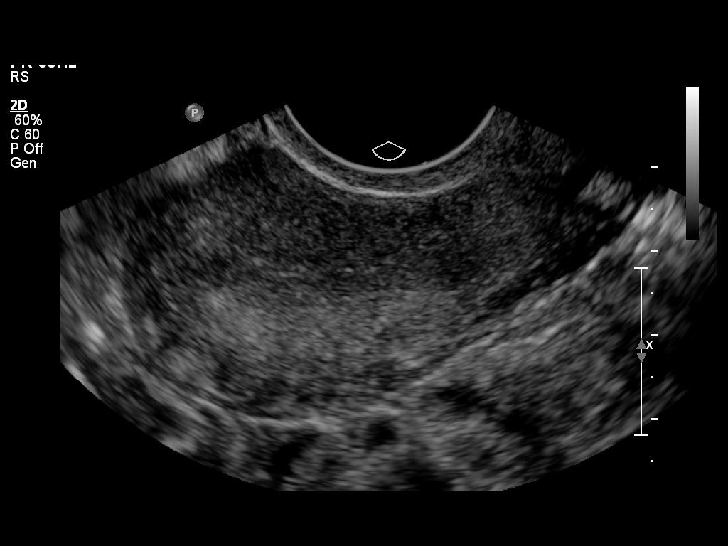
[im 9/53]
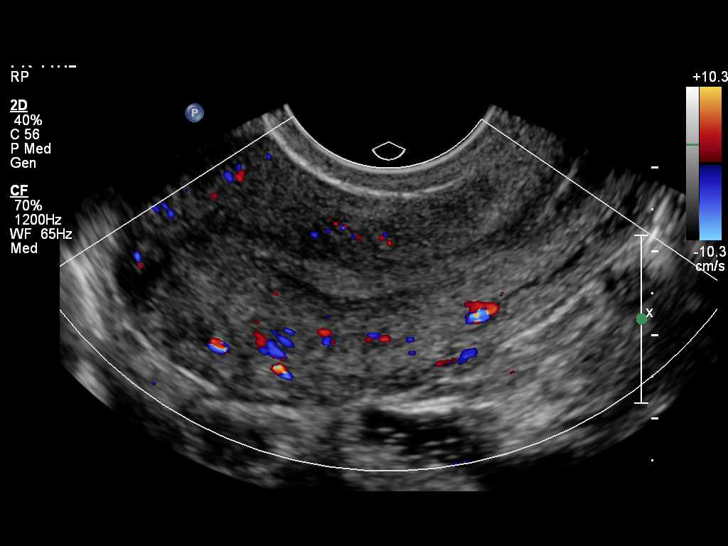
[im 14/53]
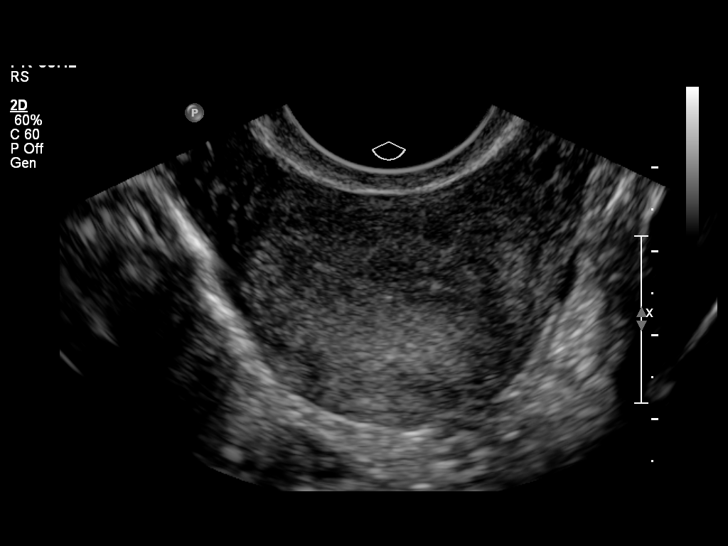
[im 18/53]
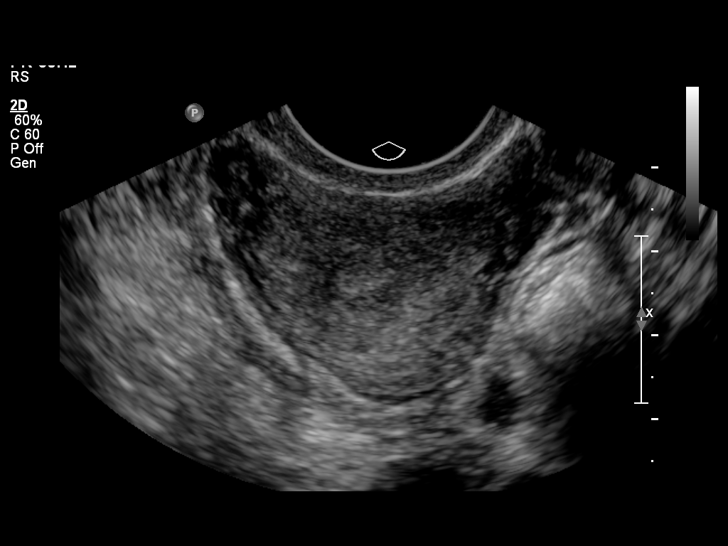
[im 20/53]
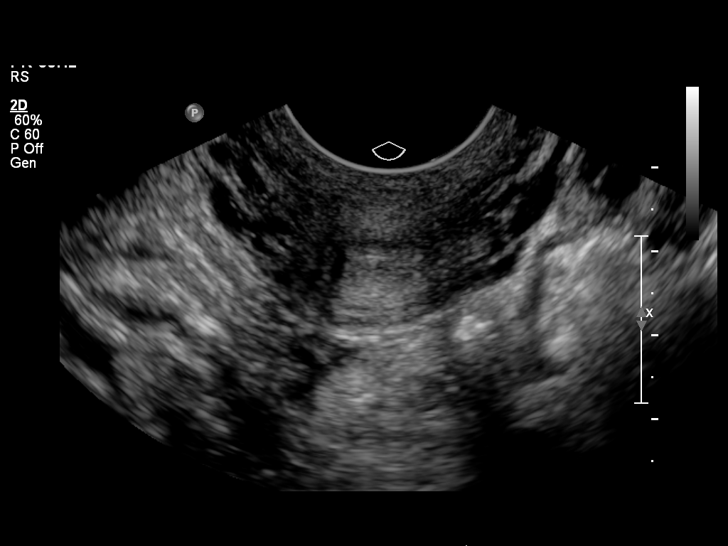
[im 24/53]
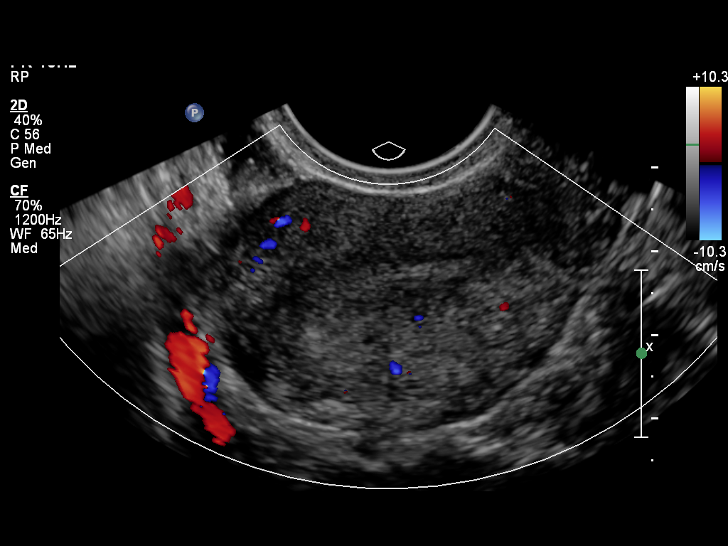
[im 29/53]
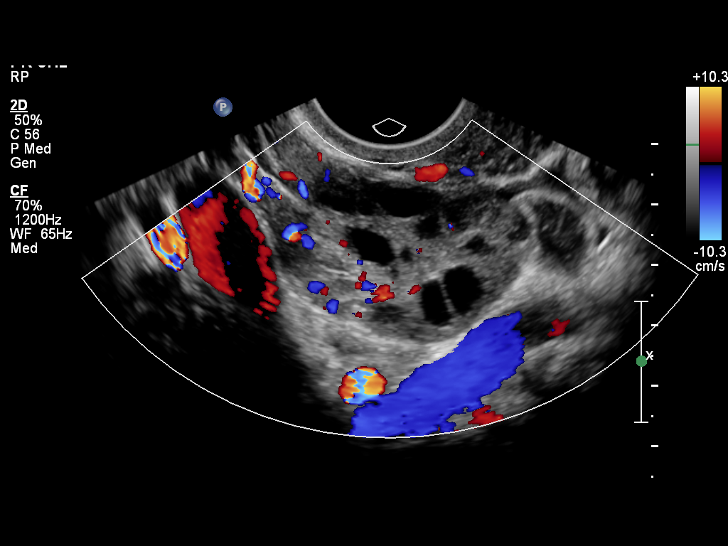
[im 33/53]
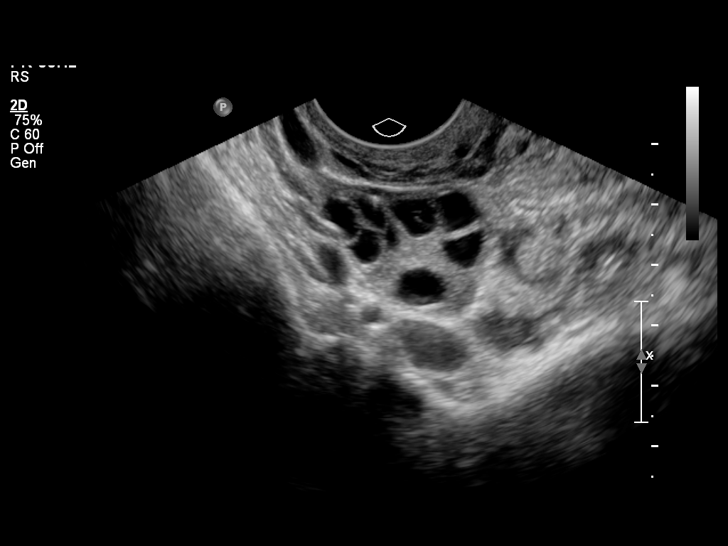
[im 35/53]
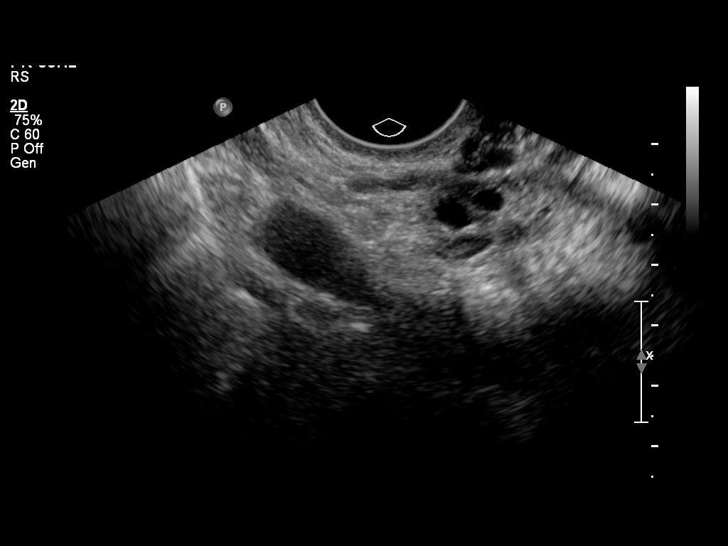
[im 40/53]
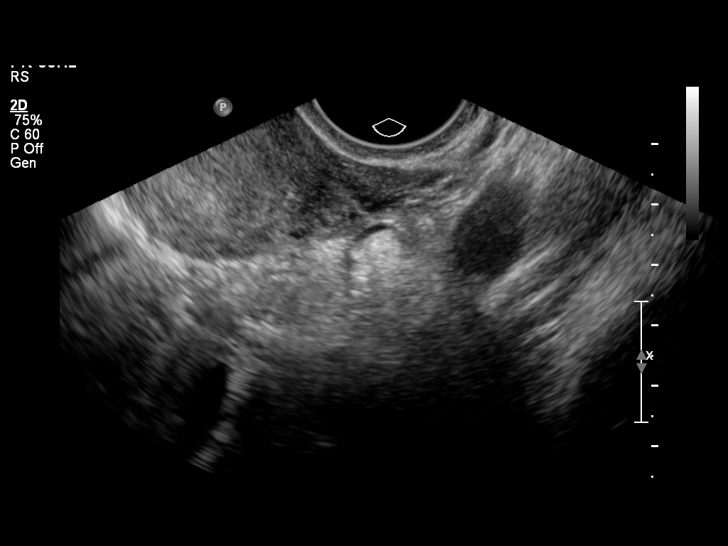
[im 44/53]
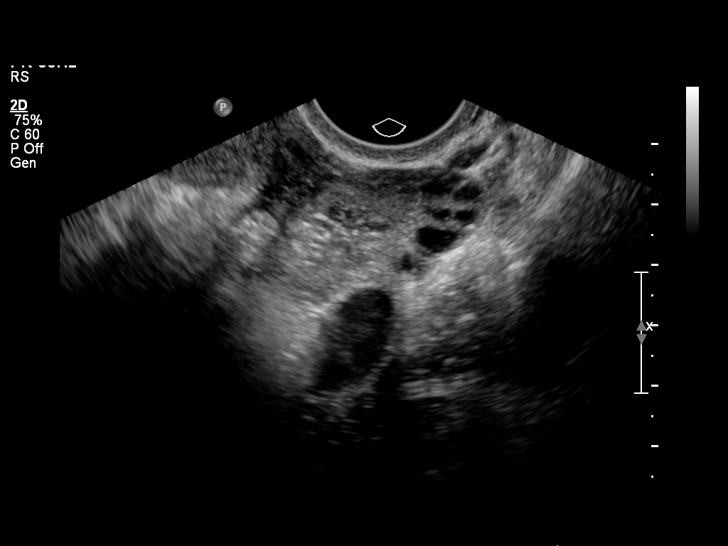
[im 48/53]
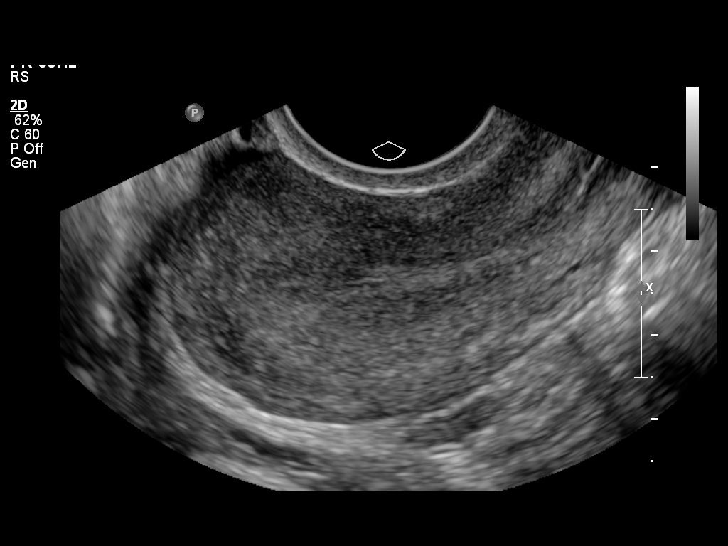
[im 53/53]
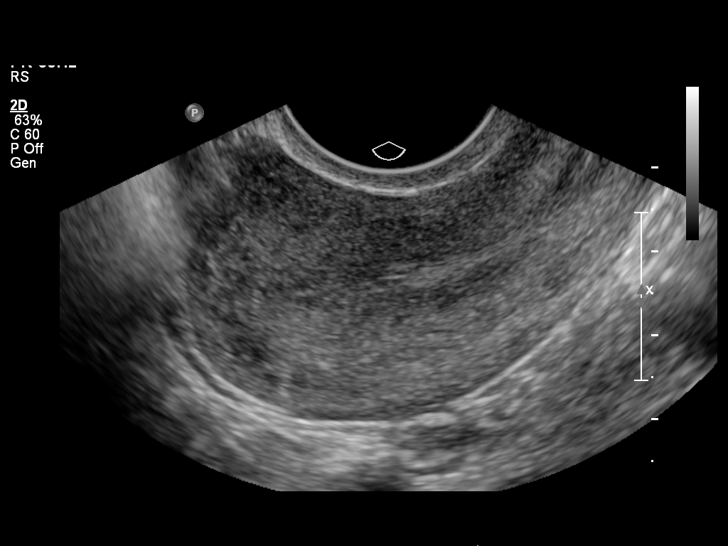

[14 of 25 positions shown; findings below may reference images not displayed]

FINDINGS: Uterus demonstrates a sagittal length of 6.4 cm, AP depth of 3.3 cm
and a transverse width of 3.7 cm.  A homogeneous uterine myometrium
is seen

Endometrium is homogeneously echogenic with an AP width of 3.8 mm.
No areas of focal thickening or heterogeneity are noted

Right Ovary measures 4.0 x 2.9 x 2.6 cm and has a normal appearance

Left Ovary measures 3.4 x 2.5 x 2.6 cm and has a normal appearance

Other Findings:  No pelvic fluid or separate adnexal masses are
seen.
IMPRESSION: Unremarkable pelvic ultrasound.

## 2012-12-24 ENCOUNTER — Emergency Department (HOSPITAL_COMMUNITY)
Admission: EM | Admit: 2012-12-24 | Discharge: 2012-12-24 | Disposition: A | Discharge status: Nursing Home (Includes ICF) | Payer: BC Managed Care – PPO | Source: Home / Self Care | Attending: Emergency Medicine | Admitting: Emergency Medicine

## 2012-12-24 ENCOUNTER — Other Ambulatory Visit: Payer: Self-pay

## 2012-12-24 ENCOUNTER — Encounter (HOSPITAL_COMMUNITY): Payer: Self-pay | Admitting: Emergency Medicine

## 2012-12-24 ENCOUNTER — Observation Stay (HOSPITAL_COMMUNITY)
Admission: EM | Admit: 2012-12-24 | Discharge: 2012-12-25 | Disposition: A | Discharge status: Home / Self Care | Payer: BC Managed Care – PPO | Attending: Internal Medicine | Admitting: Internal Medicine

## 2012-12-24 DIAGNOSIS — D693 Immune thrombocytopenic purpura: Secondary | ICD-10-CM

## 2012-12-24 DIAGNOSIS — R55 Syncope and collapse: Principal | ICD-10-CM

## 2012-12-24 DIAGNOSIS — F329 Major depressive disorder, single episode, unspecified: Secondary | ICD-10-CM

## 2012-12-24 DIAGNOSIS — Z0000 Encounter for general adult medical examination without abnormal findings: Secondary | ICD-10-CM

## 2012-12-24 DIAGNOSIS — R011 Cardiac murmur, unspecified: Secondary | ICD-10-CM

## 2012-12-24 DIAGNOSIS — M5431 Sciatica, right side: Secondary | ICD-10-CM

## 2012-12-24 DIAGNOSIS — G43909 Migraine, unspecified, not intractable, without status migrainosus: Secondary | ICD-10-CM

## 2012-12-24 DIAGNOSIS — R519 Headache, unspecified: Secondary | ICD-10-CM

## 2012-12-24 DIAGNOSIS — F32A Depression, unspecified: Secondary | ICD-10-CM

## 2012-12-24 DIAGNOSIS — F419 Anxiety disorder, unspecified: Secondary | ICD-10-CM

## 2012-12-24 DIAGNOSIS — R0902 Hypoxemia: Secondary | ICD-10-CM

## 2012-12-24 LAB — URINALYSIS, ROUTINE W REFLEX MICROSCOPIC
Glucose, UA: NEGATIVE mg/dL
Leukocytes, UA: NEGATIVE
Nitrite: NEGATIVE
Protein, ur: NEGATIVE mg/dL

## 2012-12-24 LAB — CBC WITH DIFFERENTIAL/PLATELET
Basophils Relative: 0 % (ref 0–1)
Eosinophils Absolute: 0.1 10*3/uL (ref 0.0–0.7)
HCT: 38.1 % (ref 36.0–46.0)
Hemoglobin: 13.3 g/dL (ref 12.0–15.0)
MCH: 30.7 pg (ref 26.0–34.0)
MCHC: 34.9 g/dL (ref 30.0–36.0)
MCV: 88 fL (ref 78.0–100.0)
Monocytes Absolute: 0.5 10*3/uL (ref 0.1–1.0)
Monocytes Relative: 12 % (ref 3–12)

## 2012-12-24 LAB — COMPREHENSIVE METABOLIC PANEL
Albumin: 4.1 g/dL (ref 3.5–5.2)
BUN: 6 mg/dL (ref 6–23)
Chloride: 105 mEq/L (ref 96–112)
Creatinine, Ser: 0.91 mg/dL (ref 0.50–1.10)
Total Bilirubin: 0.2 mg/dL — ABNORMAL LOW (ref 0.3–1.2)
Total Protein: 7.3 g/dL (ref 6.0–8.3)

## 2012-12-24 LAB — POCT PREGNANCY, URINE: Preg Test, Ur: NEGATIVE

## 2012-12-24 NOTE — ED Notes (Signed)
Pt reports syncope episode last night. Pt states that she was out for about 7 mins per boy friend. Leading up to episode pt felt " weird"  Boy friend states that he did cpr. Pt states that she has not felt right since incident.   Pt has also been having migraines for the past two weeks and not able to get any relief.

## 2012-12-24 NOTE — ED Provider Notes (Addendum)
Chief Complaint:   Chief Complaint  Patient presents with  . Loss of Consciousness  . Migraine    History of Present Illness:   Samantha Swanson is a 32 year old female who had an episode of syncope last night around 9:15 PM. She just come back from returning a DVD, she was at home, preparing dinner in the kitchen with her boyfriend. She felt dizzy, weak, and lightheaded, and had some mid abdominal pain. Then she blacked out. She was out approximately 7 minutes. Her boyfriend was with her and lowered her to the floor. He noted that she became cyanotic and stopped breathing. He performed CPR. She came to after about 7 minutes but was disoriented, confused, and spaced out. This lasted for about a half an hour afterwards until she finally new where she was and who she was. Neither she or her boyfriend called 911, or took her to the hospital last night.  Ever since then she's felt somewhat confused and as if she is in "slow motion." She did not have any seizure activity, tongue biting, or incontinence. She did not experience any chest pain, pressure, tightness, palpitations, or shortness of breath either before or after the episode. She does not think she hit her head. The patient notes that she has had episodes of presyncope in the past but never passed out in the past. No history of cardiac disease. She thinks she may have had a heart murmur however.  She also has a history of migraines all of her life. Is been worse the past 2 weeks. Episodes last about 2 days at a time and may occur every 2-3 days. She usually takes Excedrin for these. She notes some blurring of her vision and today her right arm felt a little bit now. She denies any muscle weakness, difficulty speaking or swallowing, or difficulty with ambulation.  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 She's not on any treatment for right now. It's been a while since her last CBC.  Review of Systems:  Other than noted above, the patient  denies any of the following symptoms. Systemic:  No fever, chills, or fatigue. Pulmonary:  No cough, wheezing, shortness of breath. Cardiac:  No chest pain, tightness, pressure, palpitations, PND, orthopnea, or edema. Ext:  No leg pain or swelling. Neuro:  No weakness, paresthesias, or difficulty with speech or gait. No vertigo or difficujlty with coordination or balance. No seizure activity, tongue biting, or incontinence.  Psych:  No anxiety or depression.  PMFSH:  Past medical history, family history, social history, meds, and allergies were reviewed and updated as needed. No history of cardiac disease.  No history of excessive alcohol intake.  No family history of sudden death.    Physical Exam:   Vital signs:  BP 104/71  Pulse 81  Temp(Src) 98.3 F (36.8 C) (Oral)  Resp 14  SpO2 100%  LMP 12/10/2012 Gen:  Alert, oriented, in no distress, skin warm and dry. Eye:  PERRL, lids and conjunctivas normal.  No stare or lid lag. ENT:  Mucous membranes moist, pharynx clear. Neck:  Supple, no adenopathy or tenderness.  No JVD.  Thyroid not enlarged. Lungs:  Clear to auscultation, no wheezes, rales or rhonchi.  No respiratory distress. Heart:  Regular rhythm, no extrasystoles.  No gallops, murmers, clicks or rubs. Abdomen:  Soft, nontender, no organomegaly or mass.  Bowel sounds normal.  No pulsatile abdominal mass or bruit. Ext:  No edema. Pulses full and equal. Neuro:  Neurological examination: The patient is alert and oriented x3. Speech is clear, fluent, and appropriate. Cranial nerves are intact. There is no pronator drift and finger to nose was normal. Muscle strength, sensation, and DTRs are normal. Babinskis are downgoing. Station and gait were normal. Romberg sign is negative, patient is able to perform tandem gait well. Skin:  Warm and dry.  No rash.  EKG:   Date: 12/24/2012  Rate: 67  Rhythm: normal sinus rhythm  QRS Axis: normal  Intervals: normal  ST/T Wave abnormalities:  normal  Conduction Disutrbances:none  Narrative Interpretation: Normal sinus rhythm with sinus arrhythmia.  Old EKG Reviewed: none available  Assessment:  The encounter diagnosis was ALTE (apparent life threatening event).  Differential diagnosis is neurocardiogenic syncope, arrhythmia, or seizure.  Plan:   1.  The following meds were prescribed:   New Prescriptions   No medications on file   2.  The patient was transferred to the emergency department via shuttle in stable condition.  Medical Decision Making:  32 year old female had an episode of syncope last night.  Felt dizzy for a few minutes then passed out.  Was unconscious for 7 minutes.  Boyfriend states she was cyanotic and not breathing.  He preformed CPR on her, but did not call 911. Since the episode she has felt spaced out, confused, disoriented and seems to be in slow motion.  No focal neuro symptoms or signs.  No other cardiac sx.  She has a hx of migraines and these have been worse the past 2 weeks.  She is being transferred for an apparent life threatening event.  EKG was normal.     Reuben Likes, MD 12/24/12 1732  Reuben Likes, MD 12/24/12 [phone]

## 2012-12-24 NOTE — ED Notes (Signed)
Patient presents to ED from urgent care with complaints of headache and syncopal episode last night. Patient states she passed out last night and her boyfriend said  She was out for 7 minutes. Patient states she fells different since last night.

## 2012-12-24 NOTE — ED Provider Notes (Signed)
CSN: [id number]     Arrival date & time 12/24/12  1751 History     First MD Initiated Contact with Patient 12/24/12 2216     Chief Complaint  Patient presents with  . Headache  . Loss of Consciousness   (Consider location/radiation/quality/duration/timing/severity/associated sxs/prior Treatment) HPI  Samantha Swanson is a 32 y.o. female with past medical history significant for ITP sent from urgent care for evaluation of loss of consciousness last night. Patient was cooking dinner, she was with her boyfriend she called out for her boyfriend and then passed out. There was no head trauma: her boyfriend gently lowered her to the ground. He states that she was out for approximately 7 minutes he states that she became she became cyanotic and she performed CPR likely due on TV. Patient regained consciousness with feeling groggy. There was no seizure-like activity, loss of bowel or bladder control, tongue biting. Patient had no prodrome of chest pain, palpitations, shortness of breath. Patient does endorse an  aura of a migraine with blurred vision and nausea. This is typical for her prior migraines. Patient reports increasing dyspnea on exertion, states that she is exhausted by even washing her hair.  Past Medical History  Diagnosis Date  . Chronic headaches   . Migraines   . UTI (lower urinary tract infection)   . ITP (idiopathic thrombocytopenic purpura)   . Genital herpes   . Allergy   . Heart murmur   . Sciatica of right side 11/28/2010  . Depression     not on meds, ok now  . Ovarian cyst   . Abnormal Pap smear   . Thrombocytopenia    Past Surgical History  Procedure Laterality Date  . Induced abortion      x2   Family History  Problem Relation Age of Onset  . Hypertension Other   . Stroke Other   . Mental illness Other   . Cancer Other     colon cance and lung cancer  . Heart disease Other   . Mental illness Other   . Mental illness Other   . Anesthesia problems Neg Hx   .  Other Neg Hx    History  Substance Use Topics  . Smoking status: Current Every Day Smoker -- 0.50 packs/day for 10 years    Types: Cigarettes  . Smokeless tobacco: Never Used  . Alcohol Use: No   OB History   Grav Para Term Preterm Abortions TAB SAB Ect Mult Living   4 1 0 1 3 2 1 0 0 1      Review of Systems 10 systems reviewed and found to be negative, except as noted in the HPI   Allergies  Latex  Home Medications   Current Outpatient Rx  Name  Route  Sig  Dispense  Refill  . cholecalciferol (VITAMIN D) 1000 UNITS tablet   Oral   Take 1,000 Units by mouth daily.         . fish oil-omega-3 fatty acids 1000 MG capsule   Oral   Take 2 g by mouth daily.         . Flaxseed, Linseed, (FLAX PO)   Oral   Take by mouth.         . Ginkgo Biloba 120 MG CAPS   Oral   Take 120 mg by mouth daily.          Marland Kitchen ibuprofen (ADVIL,MOTRIN) 200 MG tablet   Oral   Take 400 mg by mouth daily  as needed for headache.          Marland Kitchen OVER THE COUNTER MEDICATION   Oral   Take 2 tablets by mouth every morning. OTC, Herbal Supplement~B Pollen/Detox Cleanse         . vitamin B-12 (CYANOCOBALAMIN) 500 MCG tablet   Oral   Take 500 mcg by mouth daily.         . vitamin E 1000 UNIT capsule   Oral   Take 1,000 Units by mouth daily.          BP 121/81  Pulse 75  Temp(Src) 97.1 F (36.2 C) (Oral)  Resp 16  SpO2 100%  LMP 12/10/2012 Physical Exam  Nursing note and vitals reviewed. Constitutional: She is oriented to person, place, and time. She appears well-developed and well-nourished. No distress.  HENT:  Head: Normocephalic and atraumatic.  Mouth/Throat: Oropharynx is clear and moist.  No conjunctival pallor  Eyes: Conjunctivae and EOM are normal. Pupils are equal, round, and reactive to light.  Neck: Normal range of motion. Neck supple. No JVD present. No tracheal deviation present.  No midline tenderness to palpation or step-offs appreciated. Patient has full range  of motion without pain.   Cardiovascular: Normal rate, regular rhythm, normal heart sounds and intact distal pulses.   Pulmonary/Chest: Effort normal and breath sounds normal. No stridor. No respiratory distress. She has no wheezes. She has no rales. She exhibits no tenderness.  Abdominal: Soft. Bowel sounds are normal. She exhibits no distension and no mass. There is no tenderness. There is no rebound and no guarding.  Musculoskeletal: Normal range of motion.  No calf asymmetry, superficial collaterals, palpable cords, edema, Homans sign negative bilaterally.    Neurological: She is alert and oriented to person, place, and time.  Psychiatric: She has a normal mood and affect.    ED Course   Procedures (including critical care time)  Labs Reviewed  CBC WITH DIFFERENTIAL - Abnormal; Notable for the following:    Platelets 103 (*)    Neutrophils Relative % 32 (*)    Neutro Abs 1.4 (*)    Lymphocytes Relative 53 (*)    All other components within normal limits  COMPREHENSIVE METABOLIC PANEL - Abnormal; Notable for the following:    Total Bilirubin 0.2 (*)    GFR calc non Af Amer 84 (*)    All other components within normal limits  URINALYSIS, ROUTINE W REFLEX MICROSCOPIC  POCT PREGNANCY, URINE  POCT CBG (FASTING - GLUCOSE)-MANUAL ENTRY   Dg Chest 2 View  12/25/2012   *RADIOLOGY REPORT*  Clinical Data: Shortness of breath.  CHEST - 2 VIEW  Comparison: PA and lateral chest 10/18/2010.  Findings: Lungs are clear.  No pneumothorax or pleural fluid. Heart size normal.  No bony abnormality.  IMPRESSION: Normal chest.   Original Report Authenticated By: Holley Dexter, M.D.   Ct Head Wo Contrast  12/25/2012   *RADIOLOGY REPORT*  Clinical Data:  Severe headache  CT HEAD WITHOUT CONTRAST  Technique:  Contiguous axial images were obtained from the base of the skull through the vertex without contrast  Comparison:  None.  Findings:  The brain has a normal appearance without evidence for  hemorrhage, acute infarction, hydrocephalus, or mass lesion.  There is no extra axial fluid collection.  The skull and paranasal sinuses are normal.  IMPRESSION: Normal CT of the head without contrast.   Original Report Authenticated By: Judie Petit. Miles Costain, M.D.    Date: 12/25/2012  Rate: 72  Rhythm: normal  sinus rhythm  QRS Axis: normal  Intervals: normal  ST/T Wave abnormalities: normal  Conduction Disutrbances:none  Narrative Interpretation:   Old EKG Reviewed: none available   1. Syncope   2. Hypoxia     MDM   Filed Vitals:   12/24/12 2345 12/25/12 0000 12/25/12 0136 12/25/12 0137  BP:  101/57 139/97   Pulse: 77 63  67  Temp:      TempSrc:      Resp: 21 10  12   SpO2: 100% 100%  100%     Samantha Swanson is a 32 y.o. female  with questionable loss of consciousness requiring CPR over 24 hours ago. Patient is asymptomatic except for a feeling of grogginess and sensation that her migraine headaches are starting. EKG shows no abnormalities, troponin d-dimer negative. Patient is not anemic. Her platelets are low consistent with prior readings for her ITP. Chest x-ray and head CT also are negative. Physical exam is reassuring.  The patient was ambulated her O2 sats dropped to 80%. In light of this in the questionable loss of consciousness requiring CPR she will be brought into the hospital for observation. Dr. Rolland Bimler will admit her to a telemetry bed.    Wynetta Emery, PA-C 12/25/12 [phone]

## 2012-12-25 ENCOUNTER — Encounter (HOSPITAL_COMMUNITY): Payer: Self-pay | Admitting: Radiology

## 2012-12-25 ENCOUNTER — Emergency Department (HOSPITAL_COMMUNITY): Payer: BC Managed Care – PPO

## 2012-12-25 DIAGNOSIS — R0902 Hypoxemia: Secondary | ICD-10-CM

## 2012-12-25 DIAGNOSIS — R55 Syncope and collapse: Secondary | ICD-10-CM

## 2012-12-25 DIAGNOSIS — R011 Cardiac murmur, unspecified: Secondary | ICD-10-CM

## 2012-12-25 LAB — RAPID URINE DRUG SCREEN, HOSP PERFORMED
Amphetamines: NOT DETECTED
Tetrahydrocannabinol: POSITIVE — AB

## 2012-12-25 LAB — D-DIMER, QUANTITATIVE: D-Dimer, Quant: <0.27 ug/mL-FEU (ref 0.00–0.48)

## 2012-12-25 LAB — TROPONIN I: Troponin I: <0.3 ng/mL (ref <0.30)

## 2012-12-25 MED ORDER — IBUPROFEN 400 MG PO TABS
400.0000 mg | ORAL_TABLET | Freq: Every day | ORAL | Status: DC | PRN
  Filled 2012-12-25: qty 1

## 2012-12-25 MED ORDER — IBUPROFEN 400 MG PO TABS
400.0000 mg | ORAL_TABLET | Freq: Once | ORAL | Status: AC
  Administered 2012-12-25: 400 mg via ORAL
  Filled 2012-12-25: qty 1

## 2012-12-25 MED ORDER — ONDANSETRON HCL 4 MG/2ML IJ SOLN: 4.0000 mg | Freq: Three times a day (TID) | INTRAMUSCULAR | Status: AC | PRN

## 2012-12-25 MED ORDER — SODIUM CHLORIDE 0.9 % IJ SOLN
3.0000 mL | Freq: Two times a day (BID) | INTRAMUSCULAR | Status: DC
  Administered 2012-12-25: 3 mL via INTRAVENOUS

## 2012-12-25 NOTE — Progress Notes (Signed)
Ms. Hubers is unable to work from 12/24/12 through 12/26/12 due to illness.   Sincerely,   Crista Curb, M.D.

## 2012-12-25 NOTE — H&P (Signed)
[facility] History and Physical  Samantha Swanson:[id number] DOB: 25-Mar-1981 DOA: 12/24/2012  Referring physician: ED PCP: Oliver Barre, MD  Specialists: None  Chief Complaint: Syncope  HPI: Samantha Swanson is a 32 y.o. female past medical history significant for ITP sent from urgent care for evaluation of loss of consciousness last night. Patient was cooking dinner, she was with her boyfriend she called out for her boyfriend and then passed out. There was no head trauma: her boyfriend gently lowered her to the ground. He states that she was out for approximately 7 minutes he states that she became she became cyanotic and he performed CPR as he had seen on TV, he did not check for a pulse.  Patient regained consciousness with feeling groggy.  They did not call 911 at this time.  There was no seizure-like activity, loss of bowel or bladder control, tongue biting.  Patient had no prodrome of chest pain, palpitations, shortness of breath. Patient does endorse an aura of a migraine with blurred vision and nausea. This is typical for her prior migraines. Patient reports increasing dyspnea on exertion for past several years, states that she is exhausted by even washing her hair.  In ED her work up was remarkable for an ambulatory O2 sat that dropped to 80% but was 100% on RA at rest.  The entirety of the remainder of her workup was unremarkable.   Review of Systems: 12 systems reviewed and otherwise negative.  Past Medical History  Diagnosis Date  . Chronic headaches   . Migraines   . UTI (lower urinary tract infection)   . ITP (idiopathic thrombocytopenic purpura)   . Genital herpes   . Allergy   . Heart murmur   . Sciatica of right side 11/28/2010  . Depression     not on meds, ok now  . Ovarian cyst   . Abnormal Pap smear   . Thrombocytopenia    Past Surgical History  Procedure Laterality Date  . Induced abortion      x2   Social History:  reports that she has been smoking  Cigarettes.  She has a 5 pack-year smoking history. She has never used smokeless tobacco. She reports that she uses illicit drugs (Marijuana) about 5 times per week. She reports that she does not drink alcohol.   Allergies  Allergen Reactions  . Latex Itching    burning    Family History  Problem Relation Age of Onset  . Hypertension Other   . Stroke Other   . Mental illness Other   . Cancer Other     colon cance and lung cancer  . Heart disease Other   . Mental illness Other   . Mental illness Other   . Anesthesia problems Neg Hx   . Other Neg Hx     Prior to Admission medications   Medication Sig Start Date End Date Taking? Authorizing Provider  cholecalciferol (VITAMIN D) 1000 UNITS tablet Take 1,000 Units by mouth daily.   Yes Historical Provider, MD  fish oil-omega-3 fatty acids 1000 MG capsule Take 2 g by mouth daily.   Yes Historical Provider, MD  Flaxseed, Linseed, (FLAX PO) Take by mouth.   Yes Historical Provider, MD  Ginkgo Biloba 120 MG CAPS Take 120 mg by mouth daily.    Yes Historical Provider, MD  ibuprofen (ADVIL,MOTRIN) 200 MG tablet Take 400 mg by mouth daily as needed for headache.    Yes Historical Provider, MD  OVER THE COUNTER  MEDICATION Take 2 tablets by mouth every morning. OTC, Herbal Supplement~B Pollen/Detox Cleanse   Yes Historical Provider, MD  vitamin B-12 (CYANOCOBALAMIN) 500 MCG tablet Take 500 mcg by mouth daily.   Yes Historical Provider, MD  vitamin E 1000 UNIT capsule Take 1,000 Units by mouth daily.   Yes Historical Provider, MD   Physical Exam: Filed Vitals:   12/25/12 0323  BP: 111/67  Pulse: 76  Temp: 97.5 F (36.4 C)  Resp: 16    General:  NAD, resting comfortably in bed Eyes: PEERLA EOMI ENT: mucous membranes moist Neck: supple w/o JVD Cardiovascular: RRR w/o MRG Respiratory: CTA B Abdomen: soft, nt, nd, bs+ Skin: no rash nor lesion Musculoskeletal: MAE, full ROM all 4 extremities Psychiatric: normal tone and  affect Neurologic: AAOx3, grossly non-focal   Labs on Admission:  Basic Metabolic Panel:  Recent Labs Lab 12/24/12 1826  NA 139  K 3.7  CL 105  CO2 29  GLUCOSE 90  BUN 6  CREATININE 0.91  CALCIUM 9.4   Liver Function Tests:  Recent Labs Lab 12/24/12 1826  AST 15  ALT 9  ALKPHOS 72  BILITOT 0.2*  PROT 7.3  ALBUMIN 4.1   No results found for this basename: LIPASE, AMYLASE,  in the last 168 hours No results found for this basename: AMMONIA,  in the last 168 hours CBC:  Recent Labs Lab 12/24/12 1826  WBC 4.5  NEUTROABS 1.4*  HGB 13.3  HCT 38.1  MCV 88.0  PLT 103*   Cardiac Enzymes:  Recent Labs Lab 12/24/12 2333  TROPONINI <0.30    BNP (last 3 results) No results found for this basename: PROBNP,  in the last 8760 hours CBG: No results found for this basename: GLUCAP,  in the last 168 hours  Radiological Exams on Admission: Dg Chest 2 View  12/25/2012   *RADIOLOGY REPORT*  Clinical Data: Shortness of breath.  CHEST - 2 VIEW  Comparison: PA and lateral chest 10/18/2010.  Findings: Lungs are clear.  No pneumothorax or pleural fluid. Heart size normal.  No bony abnormality.  IMPRESSION: Normal chest.   Original Report Authenticated By: Holley Dexter, M.D.   Ct Head Wo Contrast  12/25/2012   *RADIOLOGY REPORT*  Clinical Data:  Severe headache  CT HEAD WITHOUT CONTRAST  Technique:  Contiguous axial images were obtained from the base of the skull through the vertex without contrast  Comparison:  None.  Findings:  The brain has a normal appearance without evidence for hemorrhage, acute infarction, hydrocephalus, or mass lesion.  There is no extra axial fluid collection.  The skull and paranasal sinuses are normal.  IMPRESSION: Normal CT of the head without contrast.   Original Report Authenticated By: Judie Petit. Miles Costain, M.D.    EKG: Independently reviewed.  Assessment/Plan Active Problems:   Syncope   Hypoxia   1. Hypoxia - patient desaturating down to 80% with  ambulation apparently.  Have ordered Alpha 1 antitrypsin level as well as 2d echo to further evaluate.  Unclear regarding cause at this point.  She has essentially ruled out for PE in the ED as well. 2. Syncope - patient on tele monitor, troponin and d-dimer negative, no elevation in WBC or other labs as seen in stress response.  While possible, I do doubt that the patient had a cardiac arrest, 7 min down time, ROSC without ACLS, and has labs today that are mostly remarkable for the normality.  Code Status: Full Code (must indicate code status--if unknown or must be  presumed, indicate so) Family Communication: No family in room (indicate person spoken with, if applicable, with phone number if by telephone) Disposition Plan: Admit to obs (indicate anticipated LOS)  Time spent: 70 min  Keval Nam M. [facility] Pager [phone]  If 7PM-7AM, please contact night-coverage [email or web address] Password [facility] 12/25/2012, 5:49 AM

## 2012-12-25 NOTE — Progress Notes (Signed)
3wk event monitor for syncope requested by Dr. Lendell Caprice with internal medicine, results going to pt's PCP Dr. Oliver Barre. I left message on our scheduling voicemail for this request - pt should expect phone call in the next day or two with an appointment to pick up this monitor. Samantha Throgmorton PA-C

## 2012-12-25 NOTE — Progress Notes (Signed)
Patient admitted after midnight. Complains of right-sided headache and feels "fuzzy". No difficulty ambulating. Saturations 99% with ambulation. Echocardiogram pending. If normal, will discharge home and followup with cardiology as an outpatient to arrange event monitor.

## 2012-12-25 NOTE — Care Management Note (Unsigned)
    Page 1 of 1   12/25/2012     1:57:30 PM   CARE MANAGEMENT NOTE 12/25/2012  Patient:  Samantha Swanson, Samantha Swanson   Account Number:  [id number]  Date Initiated:  12/25/2012  Documentation initiated by:  Sumiya Mamaril  Subjective/Objective Assessment:   PT ADM ON 12/24/12 S/P SYNCOPAL EPISODE, HYPOXIA IN ED.  PTA, PT INDEPENDENT, LIVES WITH BOYFRIEND.     Action/Plan:   WILL FOLLOW FOR DISCHARGE NEEDS AS PT PROGRESSES.   Anticipated DC Date:  12/26/2012   Anticipated DC Plan:  HOME/SELF CARE      DC Planning Services  CM consult      Choice offered to / List presented to:             Status of service:  In process, will continue to follow Medicare Important Message given?   (If response is "NO", the following Medicare IM given date fields will be blank) Date Medicare IM given:   Date Additional Medicare IM given:    Discharge Disposition:    Per UR Regulation:  Reviewed for med. necessity/level of care/duration of stay  If discussed at Long Length of Stay Meetings, dates discussed:    Comments:

## 2012-12-25 NOTE — Progress Notes (Signed)
  Echocardiogram 2D Echocardiogram has been performed.  [place]ian Co 12/25/2012, 4:23 PM

## 2012-12-25 NOTE — ED Notes (Signed)
While ambulating patient, her O2 sat gradually dropped to 80% on RA. When patient returned to room and sat down sats returned to 100%. Pt stated that she felt short of breath while walking and has been noticing that during certain activities at home that she also becomes short of breath- ex. Pt states that when she washes her hair in the shower, she gets short of breath and has to get out and sit down. O2 sat 100% on RA currently while resting in bed.

## 2012-12-25 NOTE — Progress Notes (Signed)
Nursing Note: Patient ambulated in the hallways on RA and her O2 saturations stayed at 99%. HR stayed between 84-92. Lajuana Matte, RN
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
## 2012-12-26 ENCOUNTER — Encounter: Payer: Self-pay | Admitting: Cardiovascular Disease
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
## 2012-12-30 ENCOUNTER — Encounter: Payer: Self-pay | Admitting: *Deleted

## 2012-12-30 ENCOUNTER — Other Ambulatory Visit: Payer: Self-pay | Admitting: *Deleted

## 2012-12-30 ENCOUNTER — Encounter (INDEPENDENT_AMBULATORY_CARE_PROVIDER_SITE_OTHER): Payer: BC Managed Care – PPO

## 2012-12-30 DIAGNOSIS — R55 Syncope and collapse: Secondary | ICD-10-CM

## 2012-12-30 NOTE — Progress Notes (Signed)
Patient ID: Samantha Swanson, female   DOB: Jan 07, 1981, 32 y.o.   MRN: [id number] E-Cardio Verite 30 day cardiac event monitor placed on patient.

## 2014-03-23 ENCOUNTER — Encounter (HOSPITAL_COMMUNITY): Payer: Self-pay | Admitting: Radiology

## 2014-04-10 ENCOUNTER — Encounter: Payer: BC Managed Care – PPO | Admitting: Family

## 2014-11-03 ENCOUNTER — Emergency Department (HOSPITAL_COMMUNITY)
Admission: EM | Admit: 2014-11-03 | Discharge: 2014-11-03 | Disposition: A | Discharge status: Home / Self Care | Payer: Self-pay | Attending: Emergency Medicine | Admitting: Emergency Medicine

## 2014-11-03 ENCOUNTER — Encounter (HOSPITAL_COMMUNITY): Payer: Self-pay | Admitting: Emergency Medicine

## 2014-11-03 DIAGNOSIS — Z91040 Latex allergy status: Secondary | ICD-10-CM | POA: Insufficient documentation

## 2014-11-03 DIAGNOSIS — Z8619 Personal history of other infectious and parasitic diseases: Secondary | ICD-10-CM | POA: Insufficient documentation

## 2014-11-03 DIAGNOSIS — N390 Urinary tract infection, site not specified: Secondary | ICD-10-CM | POA: Insufficient documentation

## 2014-11-03 DIAGNOSIS — Z8659 Personal history of other mental and behavioral disorders: Secondary | ICD-10-CM | POA: Insufficient documentation

## 2014-11-03 DIAGNOSIS — Z79899 Other long term (current) drug therapy: Secondary | ICD-10-CM | POA: Insufficient documentation

## 2014-11-03 DIAGNOSIS — Z720 Tobacco use: Secondary | ICD-10-CM | POA: Insufficient documentation

## 2014-11-03 DIAGNOSIS — Z862 Personal history of diseases of the blood and blood-forming organs and certain disorders involving the immune mechanism: Secondary | ICD-10-CM | POA: Insufficient documentation

## 2014-11-03 DIAGNOSIS — Z8679 Personal history of other diseases of the circulatory system: Secondary | ICD-10-CM | POA: Insufficient documentation

## 2014-11-03 DIAGNOSIS — Z8742 Personal history of other diseases of the female genital tract: Secondary | ICD-10-CM | POA: Insufficient documentation

## 2014-11-03 DIAGNOSIS — Z3202 Encounter for pregnancy test, result negative: Secondary | ICD-10-CM | POA: Insufficient documentation

## 2014-11-03 DIAGNOSIS — R011 Cardiac murmur, unspecified: Secondary | ICD-10-CM | POA: Insufficient documentation

## 2014-11-03 DIAGNOSIS — Z8739 Personal history of other diseases of the musculoskeletal system and connective tissue: Secondary | ICD-10-CM | POA: Insufficient documentation

## 2014-11-03 LAB — URINALYSIS, ROUTINE W REFLEX MICROSCOPIC
BILIRUBIN URINE: NEGATIVE
GLUCOSE, UA: NEGATIVE mg/dL
KETONES UR: NEGATIVE mg/dL
Nitrite: NEGATIVE
PH: 6.5 (ref 5.0–8.0)
PROTEIN: 100 mg/dL — AB
Specific Gravity, Urine: 1.019 (ref 1.005–1.030)
Urobilinogen, UA: 0.2 mg/dL (ref 0.0–1.0)

## 2014-11-03 LAB — CBC WITH DIFFERENTIAL/PLATELET
BASOS ABS: 0 10*3/uL (ref 0.0–0.1)
Basophils Relative: 0 % (ref 0–1)
Eosinophils Absolute: 0.1 10*3/uL (ref 0.0–0.7)
Eosinophils Relative: 2 % (ref 0–5)
HCT: 37.5 % (ref 36.0–46.0)
Hemoglobin: 12.9 g/dL (ref 12.0–15.0)
LYMPHS PCT: 33 % (ref 12–46)
Lymphs Abs: 1.7 10*3/uL (ref 0.7–4.0)
MCH: 30.3 pg (ref 26.0–34.0)
MCHC: 34.4 g/dL (ref 30.0–36.0)
MCV: 88 fL (ref 78.0–100.0)
Monocytes Absolute: 0.5 10*3/uL (ref 0.1–1.0)
Monocytes Relative: 9 % (ref 3–12)
NEUTROS ABS: 2.7 10*3/uL (ref 1.7–7.7)
Neutrophils Relative %: 56 % (ref 43–77)
PLATELETS: 134 10*3/uL — AB (ref 150–400)
RBC: 4.26 MIL/uL (ref 3.87–5.11)
RDW: 12.3 % (ref 11.5–15.5)
WBC: 5 10*3/uL (ref 4.0–10.5)

## 2014-11-03 LAB — URINE MICROSCOPIC-ADD ON

## 2014-11-03 LAB — BASIC METABOLIC PANEL
Anion gap: 8 (ref 5–15)
BUN: 7 mg/dL (ref 6–20)
CALCIUM: 9 mg/dL (ref 8.9–10.3)
CHLORIDE: 103 mmol/L (ref 101–111)
CO2: 27 mmol/L (ref 22–32)
Creatinine, Ser: 1.15 mg/dL — ABNORMAL HIGH (ref 0.44–1.00)
GFR calc Af Amer: >60 mL/min (ref >60)
GFR calc non Af Amer: >60 mL/min (ref >60)
GLUCOSE: 81 mg/dL (ref 65–99)
POTASSIUM: 3.6 mmol/L (ref 3.5–5.1)
SODIUM: 138 mmol/L (ref 135–145)

## 2014-11-03 LAB — WET PREP, GENITAL
Clue Cells Wet Prep HPF POC: NONE SEEN
Trich, Wet Prep: NONE SEEN
Yeast Wet Prep HPF POC: NONE SEEN

## 2014-11-03 LAB — POC URINE PREG, ED: PREG TEST UR: NEGATIVE

## 2014-11-03 MED ORDER — CEPHALEXIN 250 MG PO CAPS
500.0000 mg | ORAL_CAPSULE | Freq: Once | ORAL | Status: AC
  Administered 2014-11-03: 500 mg via ORAL
  Filled 2014-11-03: qty 2

## 2014-11-03 MED ORDER — CEPHALEXIN 500 MG PO CAPS: 500.0000 mg | ORAL_CAPSULE | Freq: Three times a day (TID) | ORAL | Status: DC

## 2014-11-03 NOTE — Discharge Instructions (Signed)
Take Keflex as directed until gone. Follow up with your doctor for further evaluation. Refer to attached documents for more information.  °

## 2014-11-03 NOTE — ED Provider Notes (Signed)
CSN: [id number]     Arrival date & time 11/03/14  6761 History   First MD Initiated Contact with Patient 11/03/14 0820     Chief Complaint  Patient presents with  . Hematuria     (Consider location/radiation/quality/duration/timing/severity/associated sxs/prior Treatment) Patient is a 34 y.o. female presenting with hematuria. The history is provided by the patient. No language interpreter was used.  Hematuria This is a new problem. The current episode started yesterday. The problem occurs constantly. The problem has been unchanged. Associated symptoms include abdominal pain. Pertinent negatives include no arthralgias, chest pain, chills, fatigue, fever, nausea, neck pain, vomiting or weakness. Associated symptoms comments: dysuria. Nothing aggravates the symptoms. She has tried nothing for the symptoms. The treatment provided no relief.    Past Medical History  Diagnosis Date  . Chronic headaches   . Migraines   . UTI (lower urinary tract infection)   . ITP (idiopathic thrombocytopenic purpura)   . Genital herpes   . Allergy   . Heart murmur   . Sciatica of right side 11/28/2010  . Depression     not on meds, ok now  . Ovarian cyst   . Abnormal Pap smear   . Thrombocytopenia    Past Surgical History  Procedure Laterality Date  . Induced abortion      x2   Family History  Problem Relation Age of Onset  . Hypertension Other   . Stroke Other   . Mental illness Other   . Cancer Other     colon cance and lung cancer  . Heart disease Other   . Mental illness Other   . Mental illness Other   . Anesthesia problems Neg Hx   . Other Neg Hx    History  Substance Use Topics  . Smoking status: Current Every Day Smoker -- 0.50 packs/day for 10 years    Types: Cigarettes  . Smokeless tobacco: Never Used  . Alcohol Use: No   OB History    Gravida Para Term Preterm AB TAB SAB Ectopic Multiple Living   4 1 0 1 3 2 1 0 0 1      Review of Systems  Constitutional: Negative for  fever, chills and fatigue.  HENT: Negative for trouble swallowing.   Eyes: Negative for visual disturbance.  Respiratory: Negative for shortness of breath.   Cardiovascular: Negative for chest pain and palpitations.  Gastrointestinal: Positive for abdominal pain. Negative for nausea, vomiting and diarrhea.  Genitourinary: Positive for hematuria. Negative for dysuria and difficulty urinating.  Musculoskeletal: Negative for arthralgias and neck pain.  Skin: Negative for color change.  Neurological: Negative for dizziness and weakness.  Psychiatric/Behavioral: Negative for dysphoric mood.      Allergies  Latex  Home Medications   Prior to Admission medications   Medication Sig Start Date End Date Taking? Authorizing Provider  fish oil-omega-3 fatty acids 1000 MG capsule Take 2 g by mouth daily.   Yes Historical Provider, MD  Ginkgo Biloba 120 MG CAPS Take 120 mg by mouth daily.    Yes Historical Provider, MD  ibuprofen (ADVIL,MOTRIN) 200 MG tablet Take 400 mg by mouth daily as needed for headache.    Yes Historical Provider, MD  L-LYSINE PO Take 1 tablet by mouth daily.   Yes Historical Provider, MD  Linoleic Acid-Sunflower Oil (CLA PO) Take 2 tablets by mouth daily.   Yes Historical Provider, MD  MACA ROOT PO Take 1 tablet by mouth daily.   Yes Historical Provider, MD  vitamin  B-12 (CYANOCOBALAMIN) 500 MCG tablet Take 500 mcg by mouth daily.   Yes Historical Provider, MD   BP 129/86 mmHg  Pulse 88  Temp(Src) 98 F (36.7 C) (Oral)  Resp 16  SpO2 100%  LMP 10/23/2014 Physical Exam  Constitutional: She appears well-developed and well-nourished. No distress.  HENT:  Head: Normocephalic and atraumatic.  Eyes: Conjunctivae and EOM are normal.  Neck: Normal range of motion.  Cardiovascular: Normal rate and regular rhythm.  Exam reveals no gallop and no friction rub.   No murmur heard. Pulmonary/Chest: Effort normal and breath sounds normal. She has no wheezes. She has no rales.  She exhibits no tenderness.  Abdominal: Soft. She exhibits no distension. There is tenderness. There is no rebound.  Mild suprapubic tenderness to palpation. No other focal tenderness.   Genitourinary: Vagina normal.  Normal external genitalia. Moderate amount of white, frothy discharge. No CMT. No blood noted in vagina. Cervical os closed.   Musculoskeletal: Normal range of motion.  Neurological: She is alert.  Speech is goal-oriented. Moves limbs without ataxia.   Skin: Skin is warm and dry.  Psychiatric: She has a normal mood and affect. Her behavior is normal.  Nursing note and vitals reviewed.   ED Course  Procedures (including critical care time) Labs Review Labs Reviewed  WET PREP, GENITAL - Abnormal; Notable for the following:    WBC, Wet Prep HPF POC FEW (*)    All other components within normal limits  URINALYSIS, ROUTINE W REFLEX MICROSCOPIC (NOT AT [facility]) - Abnormal; Notable for the following:    Color, Urine AMBER (*)    APPearance TURBID (*)    Hgb urine dipstick LARGE (*)    Protein, ur 100 (*)    Leukocytes, UA MODERATE (*)    All other components within normal limits  CBC WITH DIFFERENTIAL/PLATELET - Abnormal; Notable for the following:    Platelets 134 (*)    All other components within normal limits  BASIC METABOLIC PANEL - Abnormal; Notable for the following:    Creatinine, Ser 1.15 (*)    All other components within normal limits  URINE MICROSCOPIC-ADD ON - Abnormal; Notable for the following:    Squamous Epithelial / LPF FEW (*)    Bacteria, UA FEW (*)    All other components within normal limits  HIV ANTIBODY (ROUTINE TESTING)  POC URINE PREG, ED  GC/CHLAMYDIA PROBE AMP ([place]) NOT AT [facility]    Imaging Review No results found.   EKG Interpretation None      MDM   Final diagnoses:  UTI (lower urinary tract infection)    9:43 AM Urinalysis shows UTI. Wet prep pending. Vitals stable and patient afebrile.   12:18 PM Patient will be  contact in 48 hours if GC/Chlamydia is positive. Vitals stable and patient afebrile.     Emilia Beck, PA-C 11/03/14 1218  Mancel Bale, MD 11/04/14 [phone]

## 2014-11-03 NOTE — ED Notes (Addendum)
Pt concerned she may have STD.

## 2014-11-03 NOTE — ED Notes (Addendum)
Pt has burning when she urinates and passed clot and when she wipes it is blood. Denies flank or back pain. Been going on since this morning. Pt reports it only burns at end of urination.

## 2014-11-04 LAB — GC/CHLAMYDIA PROBE AMP (~~LOC~~) NOT AT ARMC
Chlamydia: NEGATIVE
Neisseria Gonorrhea: NEGATIVE

## 2014-11-04 LAB — HIV ANTIBODY (ROUTINE TESTING W REFLEX): HIV Screen 4th Generation wRfx: NONREACTIVE

## 2014-12-01 ENCOUNTER — Emergency Department (HOSPITAL_COMMUNITY)
Admission: EM | Admit: 2014-12-01 | Discharge: 2014-12-01 | Disposition: A | Discharge status: Home / Self Care | Payer: Self-pay | Attending: Emergency Medicine | Admitting: Emergency Medicine

## 2014-12-01 ENCOUNTER — Encounter (HOSPITAL_COMMUNITY): Payer: Self-pay | Admitting: Emergency Medicine

## 2014-12-01 DIAGNOSIS — Z8739 Personal history of other diseases of the musculoskeletal system and connective tissue: Secondary | ICD-10-CM | POA: Insufficient documentation

## 2014-12-01 DIAGNOSIS — R011 Cardiac murmur, unspecified: Secondary | ICD-10-CM | POA: Insufficient documentation

## 2014-12-01 DIAGNOSIS — H209 Unspecified iridocyclitis: Secondary | ICD-10-CM

## 2014-12-01 DIAGNOSIS — Y929 Unspecified place or not applicable: Secondary | ICD-10-CM | POA: Insufficient documentation

## 2014-12-01 DIAGNOSIS — Z8619 Personal history of other infectious and parasitic diseases: Secondary | ICD-10-CM | POA: Insufficient documentation

## 2014-12-01 DIAGNOSIS — Z91040 Latex allergy status: Secondary | ICD-10-CM | POA: Insufficient documentation

## 2014-12-01 DIAGNOSIS — S0591XA Unspecified injury of right eye and orbit, initial encounter: Secondary | ICD-10-CM | POA: Insufficient documentation

## 2014-12-01 DIAGNOSIS — Y9389 Activity, other specified: Secondary | ICD-10-CM | POA: Insufficient documentation

## 2014-12-01 DIAGNOSIS — Z8742 Personal history of other diseases of the female genital tract: Secondary | ICD-10-CM | POA: Insufficient documentation

## 2014-12-01 DIAGNOSIS — Z87440 Personal history of urinary (tract) infections: Secondary | ICD-10-CM | POA: Insufficient documentation

## 2014-12-01 DIAGNOSIS — Y998 Other external cause status: Secondary | ICD-10-CM | POA: Insufficient documentation

## 2014-12-01 DIAGNOSIS — Z720 Tobacco use: Secondary | ICD-10-CM | POA: Insufficient documentation

## 2014-12-01 DIAGNOSIS — W500XXA Accidental hit or strike by another person, initial encounter: Secondary | ICD-10-CM | POA: Insufficient documentation

## 2014-12-01 DIAGNOSIS — Z79899 Other long term (current) drug therapy: Secondary | ICD-10-CM | POA: Insufficient documentation

## 2014-12-01 DIAGNOSIS — Z8659 Personal history of other mental and behavioral disorders: Secondary | ICD-10-CM | POA: Insufficient documentation

## 2014-12-01 DIAGNOSIS — Z862 Personal history of diseases of the blood and blood-forming organs and certain disorders involving the immune mechanism: Secondary | ICD-10-CM | POA: Insufficient documentation

## 2014-12-01 MED ORDER — FLUORESCEIN SODIUM 1 MG OP STRP
1.0000 | ORAL_STRIP | Freq: Once | OPHTHALMIC | Status: AC
  Administered 2014-12-01: 1 via OPHTHALMIC
  Filled 2014-12-01: qty 1

## 2014-12-01 MED ORDER — TETRACAINE HCL 0.5 % OP SOLN
2.0000 [drp] | Freq: Once | OPHTHALMIC | Status: AC
  Administered 2014-12-01: 2 [drp] via OPHTHALMIC
  Filled 2014-12-01: qty 2

## 2014-12-01 NOTE — Discharge Instructions (Signed)
Iritis Iritis is an inflammation of the colored part of the eye (iris). Other parts at the front of the eye may also be inflamed. The iris is part of the middle layer of the eyeball which is called the uvea or the uveal track. Any part of the uveal track can become inflamed. The other portions of the uveal track are the choroid (the thin membrane under the outer layer of the eye), and the ciliary body (joins the choroid and the iris and produces the fluid in the front of the eye).  It is extremely important to treat iritis early, as it may lead to internal eye damage causing scarring or diseases such as glaucoma. Some people have only one attack of iritis (in one or both eyes) in their lifetime, while others may get it many times. CAUSES Iritis can be associated with many different diseases, but mostly occurs in otherwise healthy people. Examples of diseases that can be associated with iritis include:  Diseases where the body's immune system attacks tissues within your own body (autoimmune diseases).  Infections (tuberculosis, gonorrhea, fungus infections, Lyme disease, infection of the lining of the heart).  Trauma or injury.  Eye diseases (acute glaucoma and others).  Inflammation from other parts of the uveal track.  Severe eye infections.  Other rare diseases. SYMPTOMS  Eye pain or aching.  Sensitivity to light.  Loss of sight or blurred vision.  Redness of the eye. This is often accompanied by a ring of redness around the outside of the cornea, or clear covering at the front of the eye (ciliary flush).  Excessive tearing of the eye(s).  A small pupil that does not enlarge in the dark and stays smaller than the other eye's pupil.  A whitish area that obscures the lower part of the colored circular iris. Sometimes this is visible when looking at the eye, where the whitish area has a "fluid level" or flat top. This is called a "hypopyon" and is actually pus inside the eye. Since  iritis causes the eye to become red, it is often confused with a much less dangerous form of "pink eye" or conjunctivitis. One of the most important symptoms is sensitivity to light. Anytime there is redness, discomfort in the eye(s) and extreme light sensitivity, it is extremely important to see an ophthalmologist as soon as possible. TREATMENT Acute iritis requires prompt medical evaluation by an eye specialist (ophthalmologist.) Treatment depends on the underlying cause but may include:  Corticosteroid eye drops and dilating eye drops. Follow your caregiver's exact instructions on taking and stopping corticosteroid medications (drops or pills).  Occasionally, the iritis will be so severe that it will not respond to commonly used medications. If this happens, it may be necessary to use steroid injections. The injections are given under the eye's outer surface. Sometimes oral medications are given. The decision on treatment used for iritis is usually made on an individual basis. HOME CARE INSTRUCTIONS Your care giver will give specific instructions regarding the use of eye medications or other medications. Be certain to follow all instructions in both taking and stopping the medications. SEEK IMMEDIATE MEDICAL CARE IF:  You have redness of one or both eye.  You experience a great deal of light sensitivity.  You have pain or aching in either eye. MAKE SURE YOU:   Understand these instructions.  Will watch your condition.  Will get help right away if you are not doing well or get worse. Document Released: 05/08/2005 Document Revised: 07/31/2011 Document   Reviewed: 10/26/2006 ExitCare Patient Information 2015 [employer]. This information is not intended to replace advice given to you by your health care provider. Make sure you discuss any questions you have with your health care provider.  
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
## 2017-12-07 ENCOUNTER — Encounter (HOSPITAL_COMMUNITY): Payer: Self-pay | Admitting: *Deleted

## 2017-12-07 ENCOUNTER — Inpatient Hospital Stay (HOSPITAL_COMMUNITY): Payer: Medicaid Other

## 2017-12-07 ENCOUNTER — Inpatient Hospital Stay (HOSPITAL_COMMUNITY)
Admission: AD | Admit: 2017-12-07 | Discharge: 2017-12-07 | Disposition: A | Discharge status: Home / Self Care | Payer: Medicaid Other | Source: Ambulatory Visit | Attending: Obstetrics & Gynecology | Admitting: Obstetrics & Gynecology

## 2017-12-07 DIAGNOSIS — Z3A14 14 weeks gestation of pregnancy: Secondary | ICD-10-CM | POA: Diagnosis not present

## 2017-12-07 DIAGNOSIS — Z3A08 8 weeks gestation of pregnancy: Secondary | ICD-10-CM | POA: Diagnosis not present

## 2017-12-07 DIAGNOSIS — O209 Hemorrhage in early pregnancy, unspecified: Secondary | ICD-10-CM | POA: Insufficient documentation

## 2017-12-07 DIAGNOSIS — O3680X0 Pregnancy with inconclusive fetal viability, not applicable or unspecified: Secondary | ICD-10-CM

## 2017-12-07 DIAGNOSIS — Z823 Family history of stroke: Secondary | ICD-10-CM | POA: Insufficient documentation

## 2017-12-07 DIAGNOSIS — Z8249 Family history of ischemic heart disease and other diseases of the circulatory system: Secondary | ICD-10-CM | POA: Diagnosis not present

## 2017-12-07 DIAGNOSIS — Z818 Family history of other mental and behavioral disorders: Secondary | ICD-10-CM | POA: Diagnosis not present

## 2017-12-07 DIAGNOSIS — Z801 Family history of malignant neoplasm of trachea, bronchus and lung: Secondary | ICD-10-CM | POA: Insufficient documentation

## 2017-12-07 DIAGNOSIS — Z79899 Other long term (current) drug therapy: Secondary | ICD-10-CM | POA: Diagnosis not present

## 2017-12-07 DIAGNOSIS — Z91040 Latex allergy status: Secondary | ICD-10-CM | POA: Insufficient documentation

## 2017-12-07 DIAGNOSIS — O283 Abnormal ultrasonic finding on antenatal screening of mother: Secondary | ICD-10-CM | POA: Insufficient documentation

## 2017-12-07 LAB — WET PREP, GENITAL
CLUE CELLS WET PREP: NONE SEEN
Sperm: NONE SEEN
Trich, Wet Prep: NONE SEEN
Yeast Wet Prep HPF POC: NONE SEEN

## 2017-12-07 LAB — URINALYSIS, ROUTINE W REFLEX MICROSCOPIC
BILIRUBIN URINE: NEGATIVE
GLUCOSE, UA: NEGATIVE mg/dL
HGB URINE DIPSTICK: NEGATIVE
KETONES UR: NEGATIVE mg/dL
Leukocytes, UA: NEGATIVE
Nitrite: NEGATIVE
PROTEIN: NEGATIVE mg/dL
Specific Gravity, Urine: 1.015 (ref 1.005–1.030)
pH: 6 (ref 5.0–8.0)

## 2017-12-07 LAB — CBC
HCT: 37.4 % (ref 36.0–46.0)
Hemoglobin: 12.7 g/dL (ref 12.0–15.0)
MCH: 29.5 pg (ref 26.0–34.0)
MCHC: 34 g/dL (ref 30.0–36.0)
MCV: 87 fL (ref 78.0–100.0)
PLATELETS: 95 10*3/uL — AB (ref 150–400)
RBC: 4.3 MIL/uL (ref 3.87–5.11)
RDW: 12.7 % (ref 11.5–15.5)
WBC: 4.7 10*3/uL (ref 4.0–10.5)

## 2017-12-07 LAB — HCG, QUANTITATIVE, PREGNANCY: hCG, Beta Chain, Quant, S: 26126 m[IU]/mL — ABNORMAL HIGH (ref <5)

## 2017-12-07 LAB — POCT PREGNANCY, URINE: Preg Test, Ur: POSITIVE — AB

## 2017-12-07 LAB — ABO/RH: ABO/RH(D): O POS

## 2017-12-07 NOTE — MAU Provider Note (Signed)
Chief Complaint: Vaginal Bleeding   First Provider Initiated Contact with Patient 12/07/17 1829     SUBJECTIVE HPI: Samantha Swanson is a 37 y.o. [id number] at [redacted]w[redacted]d who presents to Maternity Admissions reporting vaginal bleeding.  Symptoms began today after intercourse. Reports pink spotting on toilet paper when she wipes. Denies abdominal pain, dysuria, or vaginal discharge.  Has not started prenatal care.    Past Medical History:  Diagnosis Date  . Abnormal Pap smear   . Allergy   . Chronic headaches   . Depression    not on meds, ok now  . Genital herpes   . Heart murmur   . ITP (idiopathic thrombocytopenic purpura)   . Migraines   . Ovarian cyst   . Sciatica of right side 11/28/2010  . Thrombocytopenia (HCC)   . UTI (lower urinary tract infection)    OB History  Gravida Para Term Preterm AB Living  5 1 0 1 3 1   SAB TAB Ectopic Multiple Live Births  1 2 0 0 1    # Outcome Date GA Lbr Len/2nd Weight Sex Delivery Anes PTL Lv  5 Current           4 Preterm 2004 [redacted]w[redacted]d   F Vag-Spont EPI Y LIV  3 TAB           2 TAB           1 SAB            Past Surgical History:  Procedure Laterality Date  . INDUCED ABORTION     x2   Social History   Socioeconomic History  . Marital status: Single    Spouse name: Not on file  . Number of children: Not on file  . Years of education: 30  . Highest education level: Not on file  Occupational History  . Occupation: IT consultant  Social Needs  . Financial resource strain: Not on file  . Food insecurity:    Worry: Not on file    Inability: Not on file  . Transportation needs:    Medical: Not on file    Non-medical: Not on file  Tobacco Use  . Smoking status: Former Smoker    Packs/day: 0.50    Years: 10.00    Pack years: 5.00    Types: Cigarettes  . Smokeless tobacco: Never Used  Substance and Sexual Activity  . Alcohol use: Not Currently  . Drug use: Not Currently    Frequency: 5.0 times per week    Types: Marijuana   Comment: smokes marijuana  . Sexual activity: Yes    Birth control/protection: Condom  Lifestyle  . Physical activity:    Days per week: Not on file    Minutes per session: Not on file  . Stress: Not on file  Relationships  . Social connections:    Talks on phone: Not on file    Gets together: Not on file    Attends religious service: Not on file    Active member of club or organization: Not on file    Attends meetings of clubs or organizations: Not on file    Relationship status: Not on file  . Intimate partner violence:    Fear of current or ex partner: Not on file    Emotionally abused: Not on file    Physically abused: Not on file    Forced sexual activity: Not on file  Other Topics Concern  . Not on file  Social History Narrative  .  Not on file   Family History  Problem Relation Age of Onset  . Mental illness Other   . Hypertension Other   . Stroke Other   . Mental illness Other   . Cancer Other        colon cance and lung cancer  . Heart disease Other   . Mental illness Other   . Anesthesia problems Neg Hx   . Other Neg Hx    No current facility-administered medications on file prior to encounter.    Current Outpatient Medications on File Prior to Encounter  Medication Sig Dispense Refill  . Prenatal Vit-Fe Fumarate-FA (PRENATAL MULTIVITAMIN) TABS tablet Take 1 tablet by mouth daily at 12 noon.    . cephALEXin (KEFLEX) 500 MG capsule Take 1 capsule (500 mg total) by mouth 3 (three) times daily. 21 capsule 0  . fish oil-omega-3 fatty acids 1000 MG capsule Take 2 g by mouth daily.    . Ginkgo Biloba 120 MG CAPS Take 120 mg by mouth daily.     Marland Kitchen ibuprofen (ADVIL,MOTRIN) 200 MG tablet Take 400 mg by mouth daily as needed for headache.     . L-LYSINE PO Take 1 tablet by mouth daily.    . Linoleic Acid-Sunflower Oil (CLA PO) Take 2 tablets by mouth daily.    Marland Kitchen MACA ROOT PO Take 1 tablet by mouth daily.    . vitamin B-12 (CYANOCOBALAMIN) 500 MCG tablet Take 500 mcg  by mouth daily.     Allergies  Allergen Reactions  . Latex Itching    burning    I have reviewed patient's Past Medical Hx, Surgical Hx, Family Hx, Social Hx, medications and allergies.   Review of Systems  Constitutional: Negative.   Gastrointestinal: Negative.   Genitourinary: Positive for vaginal bleeding. Negative for dysuria and vaginal discharge.    OBJECTIVE Patient Vitals for the past 24 hrs:  BP Temp Temp src Pulse Resp Height Weight  12/07/17 1635 116/63 98.4 F (36.9 C) Oral 89 18 5\' 4"  (1.626 m) 159 lb (72.1 kg)   Constitutional: Well-developed, well-nourished female in no acute distress.  Cardiovascular: normal rate Respiratory: normal rate and effort.  GI: Abd soft, non-tender, gravid appropriate for gestational age. Pos BS x 4 MS: Extremities nontender, no edema, normal ROM Neurologic: Alert and oriented x 4.  GU:   SPECULUM EXAM: NEFG, physiologic discharge, no blood noted, cervix clean. Minor friability  BIMANUAL: cervix closed; uterus normal size, no adnexal tenderness or masses.  No CMT.  LAB RESULTS Results for orders placed or performed during the hospital encounter of 12/07/17 (from the past 24 hour(s))  Urinalysis, Routine w reflex microscopic     Status: None   Collection Time: 12/07/17  4:44 PM  Result Value Ref Range   Color, Urine YELLOW YELLOW   APPearance CLEAR CLEAR   Specific Gravity, Urine 1.015 1.005 - 1.030   pH 6.0 5.0 - 8.0   Glucose, UA NEGATIVE NEGATIVE mg/dL   Hgb urine dipstick NEGATIVE NEGATIVE   Bilirubin Urine NEGATIVE NEGATIVE   Ketones, ur NEGATIVE NEGATIVE mg/dL   Protein, ur NEGATIVE NEGATIVE mg/dL   Nitrite NEGATIVE NEGATIVE   Leukocytes, UA NEGATIVE NEGATIVE  Pregnancy, urine POC     Status: Abnormal   Collection Time: 12/07/17  4:50 PM  Result Value Ref Range   Preg Test, Ur POSITIVE (A) NEGATIVE  CBC     Status: Abnormal   Collection Time: 12/07/17  6:44 PM  Result Value Ref Range  WBC 4.7 4.0 - 10.5 K/uL    RBC 4.30 3.87 - 5.11 MIL/uL   Hemoglobin 12.7 12.0 - 15.0 g/dL   HCT 81.1 91.4 - 78.2 %   MCV 87.0 78.0 - 100.0 fL   MCH 29.5 26.0 - 34.0 pg   MCHC 34.0 30.0 - 36.0 g/dL   RDW 95.6 21.3 - 08.6 %   Platelets 95 (L) 150 - 400 K/uL  ABO/Rh     Status: None   Collection Time: 12/07/17  6:44 PM  Result Value Ref Range   ABO/RH(D)      O POS Performed at [facility], [address], [place], [place] [address]   hCG, quantitative, pregnancy     Status: Abnormal   Collection Time: 12/07/17  6:44 PM  Result Value Ref Range   hCG, Beta Chain, Quant, S 26,126 (H) <5 mIU/mL  Wet prep, genital     Status: Abnormal   Collection Time: 12/07/17  8:35 PM  Result Value Ref Range   Yeast Wet Prep HPF POC NONE SEEN NONE SEEN   Trich, Wet Prep NONE SEEN NONE SEEN   Clue Cells Wet Prep HPF POC NONE SEEN NONE SEEN   WBC, Wet Prep HPF POC MANY (A) NONE SEEN   Sperm NONE SEEN     IMAGING US Ob Less Than 14 Weeks With Ob Transvaginal  Result Date: 12/07/2017 CLINICAL DATA:  Vaginal bleeding. Seven weeks and 2 days pregnant by last menstrual period. Quantitative beta HCG 26,126. EXAM: OBSTETRIC <14 WK ULTRASOUND TECHNIQUE: Transabdominal ultrasound was performed for evaluation of the gestation as well as the maternal uterus and adnexal regions. COMPARISON:  Pelvic ultrasound dated 09/20/2010. FINDINGS: Intrauterine gestational sac: Visualized. The sac is irregular and contains floating internal debris. Yolk sac:  Not visualized Embryo:  Not visualized MSD: 12.9 mm   6 w   0 d Subchorionic hemorrhage:  None visualized. Maternal uterus/adnexae: Right ovarian corpus luteum. Normal appearing left ovary. No adnexal mass. Trace amount of free peritoneal fluid. IMPRESSION: Irregular fluid collection containing floating internal debris in the endometrial cavity with no yolk sac or fetal pole and giving an estimated gestational age by mean sac diameter of 6 weeks and 0 days. This is suspicious for an abnormal,  irregular gestational sac. Findings are suspicious but not yet definitive for failed pregnancy. Recommend follow-up serial quantitative beta HCG determinations and [place] in 10-14 days for definitive diagnosis. This recommendation follows SRU consensus guidelines: Diagnostic Criteria for Nonviable Pregnancy Early in the First Trimester. Malva Limes Med 2013; 962:9528-41. Findings meet definitive criteria for failed pregnancy. This follows SRU consensus guidelines: Diagnostic Criteria for Nonviable Pregnancy Early in the First Trimester. Macy Mis J Med [phone]. Electronically Signed   By: Beckie Salts M.D.   On: 12/07/2017 20:28    MAU COURSE Orders Placed This Encounter  Procedures  . Wet prep, genital  . US OB LESS THAN 14 WEEKS WITH OB TRANSVAGINAL  . Urinalysis, Routine w reflex microscopic  . CBC  . hCG, quantitative, pregnancy  . Pregnancy, urine POC  . ABO/Rh   No orders of the defined types were placed in this encounter.   MDM +UPT UA, wet prep, GC/chlamydia, CBC, ABO/Rh, quant hCG, and [place] today to rule out ectopic pregnancy O positive No blood on exam & cervix closed. Small area of friability near os Ultrasound shows irregular shaped IUGS w/some internal debris. Will bring pt back on Sunday to see if HCG is dropping. May consider f/u ultrasound.  Has appt with [facility] scheduled next month but has never seen them.   ASSESSMENT 1. Pregnancy of unknown anatomic location   2. Vaginal bleeding in pregnancy, first trimester     PLAN Discharge home in stable condition. SAB vs ectopic precautions  Allergies as of 12/07/2017      Reactions   Latex Itching   burning      Medication List    STOP taking these medications   cephALEXin 500 MG capsule Commonly known as:  KEFLEX   CLA PO   fish oil-omega-3 fatty acids 1000 MG capsule   Ginkgo Biloba 120 MG Caps   ibuprofen 200 MG tablet Commonly known as:  ADVIL,MOTRIN   L-LYSINE PO   MACA ROOT PO   vitamin B-12 500 MCG  tablet Commonly known as:  CYANOCOBALAMIN     TAKE these medications   prenatal multivitamin Tabs tablet Take 1 tablet by mouth daily at 12 noon.        Judeth HornLawrence, Marcea Rojek, NP 12/07/2017  8:45 PM

## 2017-12-07 NOTE — Discharge Instructions (Signed)
Return to care  °· If you have heavier bleeding that soaks through more that 2 pads per hour for an hour or more °· If you bleed so much that you feel like you might pass out or you do pass out °· If you have significant abdominal pain that is not improved with Tylenol  °· If you develop a fever > 100.5 ° °

## 2017-12-07 NOTE — MAU Note (Signed)
Pt reprots she stared having some vaginal bleeding after intercourse today. Denies any abd pain or cramping at this time.

## 2017-12-09 ENCOUNTER — Inpatient Hospital Stay (HOSPITAL_COMMUNITY)
Admission: AD | Admit: 2017-12-09 | Discharge: 2017-12-09 | Disposition: A | Discharge status: Home / Self Care | Payer: Medicaid Other | Source: Ambulatory Visit | Attending: Obstetrics and Gynecology | Admitting: Obstetrics and Gynecology

## 2017-12-09 DIAGNOSIS — O039 Complete or unspecified spontaneous abortion without complication: Secondary | ICD-10-CM

## 2017-12-09 LAB — HCG, QUANTITATIVE, PREGNANCY: hCG, Beta Chain, Quant, S: 21884 m[IU]/mL — ABNORMAL HIGH (ref <5)

## 2017-12-09 MED ORDER — IBUPROFEN 600 MG PO TABS: 600.0000 mg | ORAL_TABLET | Freq: Four times a day (QID) | ORAL | 1 refills | Status: DC | PRN

## 2017-12-09 MED ORDER — MISOPROSTOL 200 MCG PO TABS: ORAL_TABLET | ORAL | 1 refills | Status: DC

## 2017-12-09 MED ORDER — PROMETHAZINE HCL 25 MG PO TABS: 25.0000 mg | ORAL_TABLET | Freq: Four times a day (QID) | ORAL | 1 refills | Status: DC | PRN

## 2017-12-09 NOTE — MAU Provider Note (Signed)
History    First Provider Initiated Contact with Patient 12/09/17 2015      Chief Complaint:  Follow-up   Samantha Swanson is  37 y.o. [id number] Patient's last menstrual period was 10/17/2017 (approximate).. Patient is here for follow up of quantitative HCG and ongoing surveillance of pregnancy status.   She is 6430w4d weeks gestation  by LMP.  Gestational sac measured 6 weeks on ultrasound performed 12/07/2017.  Since her last visit, the patient is without new complaint.     ROS Abdominal Pain: Denies Vaginal bleeding: none now.   Passage of clots or tissue: Denies Dizziness: Denies  O POS Performed at [facility], [address] [place], [place] [address]  [place] Ob Less Than 14 Weeks With Ob Transvaginal  Result Date: 12/07/2017 CLINICAL DATA:  Vaginal bleeding. Seven weeks and 2 days pregnant by last menstrual period. Quantitative beta HCG 26,126. EXAM: OBSTETRIC <14 WK ULTRASOUND TECHNIQUE: Transabdominal ultrasound was performed for evaluation of the gestation as well as the maternal uterus and adnexal regions. COMPARISON:  Pelvic ultrasound dated 09/20/2010. FINDINGS: Intrauterine gestational sac: Visualized. The sac is irregular and contains floating internal debris. Yolk sac:  Not visualized Embryo:  Not visualized MSD: 12.9 mm   6 w   0 d Subchorionic hemorrhage:  None visualized. Maternal uterus/adnexae: Right ovarian corpus luteum. Normal appearing left ovary. No adnexal mass. Trace amount of free peritoneal fluid. IMPRESSION: Irregular fluid collection containing floating internal debris in the endometrial cavity with no yolk sac or fetal pole and giving an estimated gestational age by mean sac diameter of 6 weeks and 0 days. This is suspicious for an abnormal, irregular gestational sac. Findings are suspicious but not yet definitive for failed pregnancy. Recommend follow-up serial quantitative beta HCG determinations and US in 10-14 days for definitive diagnosis. This recommendation  follows SRU consensus guidelines: Diagnostic Criteria for Nonviable Pregnancy Early in the First Trimester. Malva Limes Engl J Med 2013; 956:2130-86; 369:1443-51. Findings meet definitive criteria for failed pregnancy. This follows SRU consensus guidelines: Diagnostic Criteria for Nonviable Pregnancy Early in the First Trimester. Macy Mis Engl J Med [phone];369:1443-51. Electronically Signed   By: Beckie SaltsSteven  Reid M.D.   On: 12/07/2017 20:28     Her previous Quantitative HCG values are: Results for Samantha Swanson, Samantha Swanson (MRN [id number]) as of 12/10/2017 07:58  Ref. Range 12/07/2017 18:44  HCG, Beta Chain, Quant, S Latest Ref Range: <5 mIU/mL 26,126 (H)    Physical Exam   Patient Vitals for the past 24 hrs:  BP Temp Temp src Pulse Resp SpO2  12/09/17 1924 (!) 112/49 98.4 F (36.9 C) Oral 78 18 100 %   Constitutional: Well-nourished female in no apparent distress. No pallor Neuro: Alert and oriented 4 Cardiovascular: Normal rate Respiratory: Normal effort and rate Abdomen: Soft, nontender Gynecological Exam: examination not indicated  Labs: Results for orders placed or performed during the hospital encounter of 12/09/17 (from the past 24 hour(s))  hCG, quantitative, pregnancy   Collection Time: 12/09/17  6:32 PM  Result Value Ref Range   hCG, Beta Chain, Quant, S 21,884 (H) <5 mIU/mL    Ultrasound Studies:   [place] Ob Less Than 14 Weeks With Ob Transvaginal  Result Date: 12/07/2017 CLINICAL DATA:  Vaginal bleeding. Seven weeks and 2 days pregnant by last menstrual period. Quantitative beta HCG 26,126. EXAM: OBSTETRIC <14 WK ULTRASOUND TECHNIQUE: Transabdominal ultrasound was performed for evaluation of the gestation as well as the maternal uterus and adnexal regions. COMPARISON:  Pelvic ultrasound dated 09/20/2010. FINDINGS: Intrauterine gestational  sac: Visualized. The sac is irregular and contains floating internal debris. Yolk sac:  Not visualized Embryo:  Not visualized MSD: 12.9 mm   6 w   0 d Subchorionic hemorrhage:  None  visualized. Maternal uterus/adnexae: Right ovarian corpus luteum. Normal appearing left ovary. No adnexal mass. Trace amount of free peritoneal fluid. IMPRESSION: Irregular fluid collection containing floating internal debris in the endometrial cavity with no yolk sac or fetal pole and giving an estimated gestational age by mean sac diameter of 6 weeks and 0 days. This is suspicious for an abnormal, irregular gestational sac. Findings are suspicious but not yet definitive for failed pregnancy. Recommend follow-up serial quantitative beta HCG determinations and [place] in 10-14 days for definitive diagnosis. This recommendation follows SRU consensus guidelines: Diagnostic Criteria for Nonviable Pregnancy Early in the First Trimester. Malva Limes Med 2013; 440:3474-25. Findings meet definitive criteria for failed pregnancy. This follows SRU consensus guidelines: Diagnostic Criteria for Nonviable Pregnancy Early in the First Trimester. Macy Mis J Med [phone]. Electronically Signed   By: Beckie Salts M.D.   On: 12/07/2017 20:28    MAU course/MDM: Quantitative hCG ordered  Vaginal bleeding in early pregnancy with drop in Quant, but hemodynamically stable. Dr. Emelda Fear reviewed [place] images-->C/W failed IUP. Discussed options for management of incomplete AB including expectant management, Cytotec or D&C. Prefers Cytotec at this time. Verbalizes understanding that intervention may become necessary if SAB is not completed w/ Cytotec or if heavy bleeding or infection occur.   Assessment: 1. Miscarriage    Plan: Discharge home in stable condition. Bleeding precautions Support given. F/U Quant in 1 week.  Follow-up Information    [facility] Follow up in 1 week(s).   Specialty:  Obstetrics and Gynecology Why:  for repeat bloodwork Contact information: [address] [place] [address] [phone]       [facility] Follow up on  12/26/2017.   Specialty:  Obstetrics and Gynecology Why:  for follow-up appointment  Contact information: [address] [place] [address] [phone]       [facility] Follow up.   Why:  as needed in emergencies Contact information: [address] [id number] mc [place] [address] [phone]         Allergies as of 12/09/2017      Reactions   Latex Itching   burning      Medication List    TAKE these medications   ibuprofen 600 MG tablet Commonly known as:  ADVIL,MOTRIN Take 1 tablet (600 mg total) by mouth every 6 (six) hours as needed for cramping.   misoprostol 200 MCG tablet Commonly known as:  CYTOTEC Place 4 tablets in between your gums and cheeks OR insert 4 tablets vaginally. If you do not pass tissue in 24 hours take second dose.   prenatal multivitamin Tabs tablet Take 1 tablet by mouth daily at 12 noon.   promethazine 25 MG tablet Commonly known as:  PHENERGAN Take 1 tablet (25 mg total) by mouth every 6 (six) hours as needed.       Katrinka Blazing, [place], CNM 12/09/2017, 8:35 PM  2/3

## 2017-12-09 NOTE — Discharge Instructions (Signed)

## 2017-12-09 NOTE — MAU Note (Signed)
Pt here for repeat labs. No new complaints. Denies any spotting. Denies any pain.

## 2017-12-10 LAB — GC/CHLAMYDIA PROBE AMP (~~LOC~~) NOT AT ARMC
CHLAMYDIA, DNA PROBE: NEGATIVE
Neisseria Gonorrhea: NEGATIVE

## 2017-12-17 ENCOUNTER — Other Ambulatory Visit: Payer: Medicaid Other

## 2017-12-17 ENCOUNTER — Other Ambulatory Visit: Payer: Self-pay

## 2017-12-17 DIAGNOSIS — O039 Complete or unspecified spontaneous abortion without complication: Secondary | ICD-10-CM

## 2017-12-18 LAB — BETA HCG QUANT (REF LAB): hCG Quant: 364 m[IU]/mL

## 2017-12-25 ENCOUNTER — Encounter: Payer: Self-pay | Admitting: Obstetrics and Gynecology

## 2017-12-25 ENCOUNTER — Encounter: Payer: Self-pay | Admitting: General Practice

## 2017-12-26 ENCOUNTER — Other Ambulatory Visit: Payer: Self-pay | Admitting: General Practice

## 2017-12-26 DIAGNOSIS — O039 Complete or unspecified spontaneous abortion without complication: Secondary | ICD-10-CM

## 2017-12-27 ENCOUNTER — Other Ambulatory Visit: Payer: Medicaid Other

## 2017-12-27 DIAGNOSIS — O039 Complete or unspecified spontaneous abortion without complication: Secondary | ICD-10-CM

## 2017-12-28 LAB — BETA HCG QUANT (REF LAB): hCG Quant: 23 m[IU]/mL

## 2018-03-28 ENCOUNTER — Encounter (HOSPITAL_COMMUNITY): Payer: Self-pay | Admitting: *Deleted

## 2018-03-28 ENCOUNTER — Inpatient Hospital Stay (HOSPITAL_COMMUNITY)
Admission: AD | Admit: 2018-03-28 | Discharge: 2018-03-28 | Disposition: A | Discharge status: Home / Self Care | Payer: Medicaid Other | Source: Ambulatory Visit | Attending: Obstetrics and Gynecology | Admitting: Obstetrics and Gynecology

## 2018-03-28 DIAGNOSIS — N760 Acute vaginitis: Secondary | ICD-10-CM

## 2018-03-28 DIAGNOSIS — Z87891 Personal history of nicotine dependence: Secondary | ICD-10-CM | POA: Diagnosis not present

## 2018-03-28 DIAGNOSIS — B373 Candidiasis of vulva and vagina: Secondary | ICD-10-CM

## 2018-03-28 DIAGNOSIS — Z3202 Encounter for pregnancy test, result negative: Secondary | ICD-10-CM | POA: Diagnosis not present

## 2018-03-28 DIAGNOSIS — B9689 Other specified bacterial agents as the cause of diseases classified elsewhere: Secondary | ICD-10-CM | POA: Insufficient documentation

## 2018-03-28 DIAGNOSIS — B3731 Acute candidiasis of vulva and vagina: Secondary | ICD-10-CM

## 2018-03-28 DIAGNOSIS — N898 Other specified noninflammatory disorders of vagina: Secondary | ICD-10-CM | POA: Diagnosis present

## 2018-03-28 LAB — HEPATITIS B SURFACE ANTIGEN: HEP B S AG: NEGATIVE

## 2018-03-28 LAB — URINALYSIS, ROUTINE W REFLEX MICROSCOPIC
Bacteria, UA: NONE SEEN
Bilirubin Urine: NEGATIVE
Glucose, UA: NEGATIVE mg/dL
HGB URINE DIPSTICK: NEGATIVE
Ketones, ur: NEGATIVE mg/dL
NITRITE: NEGATIVE
Protein, ur: NEGATIVE mg/dL
Specific Gravity, Urine: 1.023 (ref 1.005–1.030)
pH: 5 (ref 5.0–8.0)

## 2018-03-28 LAB — WET PREP, GENITAL
SPERM: NONE SEEN
Trich, Wet Prep: NONE SEEN
Yeast Wet Prep HPF POC: NONE SEEN

## 2018-03-28 LAB — POCT PREGNANCY, URINE: PREG TEST UR: NEGATIVE

## 2018-03-28 MED ORDER — METRONIDAZOLE 500 MG PO TABS: 500.0000 mg | ORAL_TABLET | Freq: Two times a day (BID) | ORAL | 0 refills | Status: DC

## 2018-03-28 MED ORDER — FLUCONAZOLE 150 MG PO TABS: 150.0000 mg | ORAL_TABLET | Freq: Once | ORAL | 1 refills | Status: AC

## 2018-03-28 NOTE — MAU Provider Note (Signed)
History     CSN: [id number]  Arrival date and time: 03/28/18 4098   First Provider Initiated Contact with Patient 03/28/18 1021      Chief Complaint  Patient presents with  . Vaginal Itching  . Pelvic Pain   HPI Samantha Swanson is a 37 y.o. [id number] non pregnant female who presents with vaginal discharge and irritation. She states she had several teeth pulled and was started on antibiotics. She states she feels like the antibiotics gave her a yeast infection. She reported a thick white vaginal discharge with itching. She state she started using tampons soaked in tea tree oil and coconut oil with no relief. She states this typically works for her but did not this time. She states she is having mild pelvic pain that she rates a 2/10 but has not tried any medication for the pain. She is also requesting STD testing.   OB History as of 12/27/2017    Gravida  5   Para  1   Term  0   Preterm  1   AB  3   Living  1     SAB  1   TAB  2   Ectopic  0   Multiple  0   Live Births  1           Past Medical History:  Diagnosis Date  . Abnormal Pap smear   . Allergy   . Chronic headaches   . Depression    not on meds, ok now  . Genital herpes   . Heart murmur   . ITP (idiopathic thrombocytopenic purpura)   . Migraines   . Ovarian cyst   . Sciatica of right side 11/28/2010  . Thrombocytopenia (HCC)   . UTI (lower urinary tract infection)     Past Surgical History:  Procedure Laterality Date  . INDUCED ABORTION     x2    Family History  Problem Relation Age of Onset  . Mental illness Other   . Hypertension Other   . Stroke Other   . Mental illness Other   . Cancer Other        colon cance and lung cancer  . Heart disease Other   . Mental illness Other   . Anesthesia problems Neg Hx   . Other Neg Hx     Social History   Tobacco Use  . Smoking status: Former Smoker    Packs/day: 0.50    Years: 10.00    Pack years: 5.00    Types: Cigarettes  .  Smokeless tobacco: Never Used  Substance Use Topics  . Alcohol use: Not Currently  . Drug use: Not Currently    Frequency: 5.0 times per week    Types: Marijuana    Comment: smokes marijuana last used 03-22-18    Allergies:  Allergies  Allergen Reactions  . Latex Itching    burning    Medications Prior to Admission  Medication Sig Dispense Refill Last Dose  . ibuprofen (ADVIL,MOTRIN) 600 MG tablet Take 1 tablet (600 mg total) by mouth every 6 (six) hours as needed for cramping. 30 tablet 1   . misoprostol (CYTOTEC) 200 MCG tablet Place 4 tablets in between your gums and cheeks OR insert 4 tablets vaginally. If you do not pass tissue in 24 hours take second dose. 4 tablet 1   . Prenatal Vit-Fe Fumarate-FA (PRENATAL MULTIVITAMIN) TABS tablet Take 1 tablet by mouth daily at 12 noon.   12/06/2017 at Unknown  time  . promethazine (PHENERGAN) 25 MG tablet Take 1 tablet (25 mg total) by mouth every 6 (six) hours as needed. 30 tablet 1     Review of Systems  Constitutional: Negative.  Negative for fatigue and fever.  HENT: Negative.   Respiratory: Negative.  Negative for shortness of breath.   Cardiovascular: Negative.  Negative for chest pain.  Gastrointestinal: Negative.  Negative for abdominal pain, constipation, diarrhea, nausea and vomiting.  Genitourinary: Positive for vaginal discharge and vaginal pain. Negative for dysuria and vaginal bleeding.  Neurological: Negative.  Negative for dizziness and headaches.   Physical Exam   Blood pressure (!) 122/59, pulse 72, temperature 98.3 F (36.8 C), resp. rate 16, weight 177 lb 4 oz (80.4 kg), last menstrual period 10/17/2017, SpO2 99 %, unknown if currently breastfeeding.  Physical Exam  Nursing note and vitals reviewed. Constitutional: She is oriented to person, place, and time. She appears well-developed and well-nourished. No distress.  HENT:  Head: Normocephalic.  Eyes: Pupils are equal, round, and reactive to light.   Cardiovascular: Normal rate, regular rhythm and normal heart sounds.  Respiratory: Effort normal and breath sounds normal. No respiratory distress.  GI: Soft. Bowel sounds are normal. She exhibits no distension. There is no tenderness.  Genitourinary:  Genitourinary Comments: Pelvic exam: Cervix pink, visually closed, without lesion, scant yellow creamy discharge, vaginal walls and external genitalia normal Bimanual exam: Cervix 0/long/high, firm, anterior, neg CMT, uterus nontender, nonenlarged, adnexa without tenderness, enlargement, or mass   Neurological: She is alert and oriented to person, place, and time.  Skin: Skin is warm and dry.  Psychiatric: She has a normal mood and affect. Her behavior is normal. Judgment and thought content normal.    MAU Course  Procedures Results for orders placed or performed during the hospital encounter of 03/28/18 (from the past 24 hour(s))  Urinalysis, Routine w reflex microscopic     Status: Abnormal   Collection Time: 03/28/18 10:02 AM  Result Value Ref Range   Color, Urine YELLOW YELLOW   APPearance HAZY (A) CLEAR   Specific Gravity, Urine 1.023 1.005 - 1.030   pH 5.0 5.0 - 8.0   Glucose, UA NEGATIVE NEGATIVE mg/dL   Hgb urine dipstick NEGATIVE NEGATIVE   Bilirubin Urine NEGATIVE NEGATIVE   Ketones, ur NEGATIVE NEGATIVE mg/dL   Protein, ur NEGATIVE NEGATIVE mg/dL   Nitrite NEGATIVE NEGATIVE   Leukocytes, UA SMALL (A) NEGATIVE   RBC / HPF 0-5 0 - 5 RBC/hpf   WBC, UA 11-20 0 - 5 WBC/hpf   Bacteria, UA NONE SEEN NONE SEEN   Squamous Epithelial / LPF 11-20 0 - 5   Mucus PRESENT   Pregnancy, urine POC     Status: None   Collection Time: 03/28/18 10:04 AM  Result Value Ref Range   Preg Test, Ur NEGATIVE NEGATIVE  Wet prep, genital     Status: Abnormal   Collection Time: 03/28/18 10:55 AM  Result Value Ref Range   Yeast Wet Prep HPF POC NONE SEEN NONE SEEN   Trich, Wet Prep NONE SEEN NONE SEEN   Clue Cells Wet Prep HPF POC PRESENT (A)  NONE SEEN   WBC, Wet Prep HPF POC MANY (A) NONE SEEN   Sperm NONE SEEN    MDM UA, UPT Wet prep and gc/chlamydia HIV, RPR, Hep B, Hep C  Assessment and Plan   1. Bacterial vaginosis   2. Candidiasis of vagina    -Discharge home in stable condition -Rx for metronidazole and  diflucan sent  -Safe sexual precautions discussed -Patient advised to follow-up with gyn of choice to start prenatal care -Patient may return to MAU as needed or if her condition were to change or worsen  Rolm Bookbinder CNM 03/28/2018, 12:05 PM

## 2018-03-28 NOTE — MAU Note (Signed)
Patient reports vaginal itching and burning.  States she had 3 teeth pulled and was put on ABX, but started feeling like "I had a yeast infection" so she stopped taking her ABX and started using tea tree oil tampons to treat it.  Having yellow/green discharge and some slight itching and pelvic pain 2/10.

## 2018-03-28 NOTE — Discharge Instructions (Signed)

## 2018-03-29 LAB — RPR: RPR: NONREACTIVE

## 2018-03-29 LAB — GC/CHLAMYDIA PROBE AMP (~~LOC~~) NOT AT ARMC
CHLAMYDIA, DNA PROBE: NEGATIVE
Neisseria Gonorrhea: NEGATIVE

## 2018-03-29 LAB — HEPATITIS C ANTIBODY

## 2018-03-29 LAB — HIV ANTIBODY (ROUTINE TESTING W REFLEX): HIV Screen 4th Generation wRfx: NONREACTIVE

## 2018-10-24 ENCOUNTER — Encounter (HOSPITAL_COMMUNITY): Payer: Self-pay | Admitting: Emergency Medicine

## 2018-10-24 ENCOUNTER — Other Ambulatory Visit: Payer: Self-pay

## 2018-10-24 ENCOUNTER — Emergency Department (HOSPITAL_COMMUNITY)
Admission: EM | Admit: 2018-10-24 | Discharge: 2018-10-24 | Disposition: A | Discharge status: Home / Self Care | Payer: Medicaid Other | Attending: Emergency Medicine | Admitting: Emergency Medicine

## 2018-10-24 DIAGNOSIS — N760 Acute vaginitis: Secondary | ICD-10-CM | POA: Insufficient documentation

## 2018-10-24 DIAGNOSIS — B9689 Other specified bacterial agents as the cause of diseases classified elsewhere: Secondary | ICD-10-CM

## 2018-10-24 DIAGNOSIS — D693 Immune thrombocytopenic purpura: Secondary | ICD-10-CM | POA: Insufficient documentation

## 2018-10-24 DIAGNOSIS — Z87891 Personal history of nicotine dependence: Secondary | ICD-10-CM | POA: Insufficient documentation

## 2018-10-24 LAB — URINALYSIS, ROUTINE W REFLEX MICROSCOPIC
Bilirubin Urine: NEGATIVE
Glucose, UA: NEGATIVE mg/dL
Hgb urine dipstick: NEGATIVE
Ketones, ur: NEGATIVE mg/dL
Leukocytes,Ua: NEGATIVE
Nitrite: NEGATIVE
Protein, ur: NEGATIVE mg/dL
Specific Gravity, Urine: 1.003 — ABNORMAL LOW (ref 1.005–1.030)
pH: 6 (ref 5.0–8.0)

## 2018-10-24 LAB — WET PREP, GENITAL
Sperm: NONE SEEN
Trich, Wet Prep: NONE SEEN
Yeast Wet Prep HPF POC: NONE SEEN

## 2018-10-24 LAB — PREGNANCY, URINE: Preg Test, Ur: NEGATIVE

## 2018-10-24 MED ORDER — METRONIDAZOLE 500 MG PO TABS: 500.0000 mg | ORAL_TABLET | Freq: Two times a day (BID) | ORAL | 0 refills | Status: DC

## 2018-10-24 NOTE — ED Provider Notes (Signed)
[facility] Provider Note   CSN: [id number] Arrival date & time: 10/24/18  [id number]    History   Chief Complaint Chief Complaint  Patient presents with  . Vaginitis    HPI Samantha Swanson is a 38 y.o. female.     Samantha Swanson is a 38 y.o. female history of migraines, ITP, heart murmur, and BV, who presents to the emerge department for evaluation of vaginal discharge and odor.  She symptoms have been present for the past 3 weeks.  She describes a fishy vaginal odor as well as discharge.  She reports that she attempted to treat with oil and herbal suppositories.  Reports symptoms persisted despite this.  She reports 1 sexual partner but relationship is long distance and she is not frequently sexually active, and does not use protection.  She denies pelvic pain, abdominal pain, nausea, vomiting or fever.  Denies dysuria or urinary fatigue.  She has not noted any genital lesions or rash.  Particular at any time previously for BV.  She reports she has taken Flagyl in times in the past for BV.  Patient does not currently have a gynecologist.     Past Medical History:  Diagnosis Date  . Abnormal Pap smear   . Allergy   . Chronic headaches   . Depression    not on meds, ok now  . Genital herpes   . Heart murmur   . ITP (idiopathic thrombocytopenic purpura)   . Migraines   . Ovarian cyst   . Sciatica of right side 11/28/2010  . Thrombocytopenia (HCC)   . UTI (lower urinary tract infection)     Patient Active Problem List   Diagnosis Date Noted  . Syncope 12/25/2012  . Hypoxia 12/25/2012  . Preventative health care 11/28/2010  . Anxiety 11/28/2010  . Sciatica of right side 11/28/2010  . Allergy   . Depression   . Heart murmur   . Chronic headaches   . ITP (idiopathic thrombocytopenic purpura)   . Migraines   . Genital herpes     Past Surgical History:  Procedure Laterality Date  . INDUCED ABORTION     x2     OB History    Gravida   5   Para  1   Term  0   Preterm  1   AB  4   Living  1     SAB  2   TAB  2   Ectopic  0   Multiple  0   Live Births  1            Home Medications    Prior to Admission medications   Medication Sig Start Date End Date Taking? Authorizing Provider  ibuprofen (ADVIL,MOTRIN) 600 MG tablet Take 1 tablet (600 mg total) by mouth every 6 (six) hours as needed for cramping. 12/09/17   Katrinka BlazingSmith, [place], CNM  metroNIDAZOLE (FLAGYL) 500 MG tablet Take 1 tablet (500 mg total) by mouth 2 (two) times daily. One po bid x 7 days 10/24/18   Dartha LodgeFord, Teeghan Hammer N, PA-C  Prenatal Vit-Fe Fumarate-FA (PRENATAL MULTIVITAMIN) TABS tablet Take 1 tablet by mouth daily at 12 noon.    [provider]  promethazine (PHENERGAN) 25 MG tablet Take 1 tablet (25 mg total) by mouth every 6 (six) hours as needed. 12/09/17   Dorathy KinsmanSmith, [place], CNM    Family History Family History  Problem Relation Age of Onset  . Mental illness Other   .  Hypertension Other   . Stroke Other   . Mental illness Other   . Cancer Other        colon cance and lung cancer  . Heart disease Other   . Mental illness Other   . Anesthesia problems Neg Hx   . Other Neg Hx     Social History Social History   Tobacco Use  . Smoking status: Former Smoker    Packs/day: 0.50    Years: 10.00    Pack years: 5.00    Types: Cigarettes  . Smokeless tobacco: Never Used  Substance Use Topics  . Alcohol use: Not Currently  . Drug use: Not Currently    Frequency: 5.0 times per week    Types: Marijuana    Comment: smokes marijuana last used 03-22-18     Allergies   Latex   Review of Systems Review of Systems  Constitutional: Negative for chills and fever.  Gastrointestinal: Negative for abdominal pain, nausea and vomiting.  Genitourinary: Positive for vaginal discharge. Negative for dysuria, frequency, pelvic pain and vaginal bleeding.  Skin: Negative for rash.  All other systems reviewed and are negative.     Physical Exam Updated Vital Signs BP 118/73 (BP Location: Right Arm)   Pulse 82   Resp 18   SpO2 100%   Physical Exam Vitals signs and nursing note reviewed. Exam conducted with a chaperone present.  Constitutional:      General: She is not in acute distress.    Appearance: Normal appearance. She is well-developed and normal weight. She is not ill-appearing or diaphoretic.  HENT:     Head: Normocephalic and atraumatic.     Mouth/Throat:     Mouth: Mucous membranes are moist.     Pharynx: Oropharynx is clear.  Eyes:     General:        Right eye: No discharge.        Left eye: No discharge.  Pulmonary:     Effort: Pulmonary effort is normal. No respiratory distress.  Abdominal:     General: Abdomen is flat. Bowel sounds are normal. There is no distension.     Palpations: Abdomen is soft. There is no mass.     Tenderness: There is no abdominal tenderness. There is no guarding.     Comments: Abdomen soft, nondistended, nontender to palpation in all quadrants without guarding or peritoneal signs, no CVA tenderness bilaterally  Genitourinary:    Comments: Chaperone present during pelvic exam. No external genital lesions or rashes noted  Speculum exam reveals amount of thin detached white discharge.  No cervicitis noted.  On bimanual exam there is no cervical motion tenderness, no adnexal tenderness or palpable masses bilaterally. Skin:    General: Skin is warm and dry.  Neurological:     Mental Status: She is alert and oriented to person, place, and time.     Coordination: Coordination normal.  Psychiatric:        Mood and Affect: Mood normal.        Behavior: Behavior normal.      ED Treatments / Results  Labs (all labs ordered are listed, but only abnormal results are displayed) Labs Reviewed  WET PREP, GENITAL - Abnormal; Notable for the following components:      Result Value   Clue Cells Wet Prep HPF POC PRESENT (*)    WBC, Wet Prep HPF POC MANY (*)    All other  components within normal limits  URINALYSIS, ROUTINE W REFLEX MICROSCOPIC -  Abnormal; Notable for the following components:   Color, Urine STRAW (*)    Specific Gravity, Urine 1.003 (*)    All other components within normal limits  PREGNANCY, URINE  RPR  HIV ANTIBODY (ROUTINE TESTING W REFLEX)  GC/CHLAMYDIA PROBE AMP ([place]) NOT AT [facility]    EKG None  Radiology No results found.  Procedures Procedures (including critical care time)  Medications Ordered in ED Medications - No data to display   Initial Impression / Assessment and Plan / ED Course  I have reviewed the triage vital signs and the nursing notes.  Pertinent labs & imaging results that were available during my care of the patient were reviewed by me and considered in my medical decision making (see chart for details).  Patient presents for evaluation of adenoma history of recurrent BV and this feels similar, has been treating at home with herbal remedies.  Is sexually active without protection but only with 1 partner and no associated abdominal or pelvic pain.  No fevers or vomiting.  Abdominal exam is benign, vitals are normal.  I have low suspicion for PID.  Pelvic exam no adnexal tenderness or cervical motion tenderness.  Exam and wet prep consistent with bacterial vaginosis, will treat with Flagyl patient has STD pending and is aware that if she is called with any positive results she should notify any partners.  Given that she feels she is low risk for STDs consistent with her recurrent BV will hold off on prophylaxis for gonorrhea and chlamydia.  Patient expresses agreement.  Stressed the importance of following with a gynecologist especially given her history of abnormal Pap smear. Discharged home in good condition.  Final Clinical Impressions(s) / ED Diagnoses   Final diagnoses:  Bacterial vaginosis    ED Discharge Orders         Ordered    metroNIDAZOLE (FLAGYL) 500 MG tablet  2 times daily     10/24/18  1151           Jodi Geralds [place], [place] 10/24/18 1754    Derwood Kaplan, MD 10/25/18 [phone]

## 2018-10-24 NOTE — ED Notes (Signed)
Wet prep collected and sent to main lab.

## 2018-10-24 NOTE — Discharge Instructions (Signed)
Take antibiotics as directed for BV.  Do not drink alcohol while on this antibiotic.  Please call to schedule an appointment for gynecologic follow-up with the health department, you need to have a Pap smear performed since you have history of an abnormal Pap in the past.

## 2018-10-24 NOTE — ED Triage Notes (Signed)
Pt has fishy odor from vagina- hx of bacterial vaginosis. States been going on for 3 weeks. Pt has recurrent episodes since 38 yo. She has been using tea tree oil and herbal suppositories to treat.  RN provided teaching about douching and inserting things into vagina and disrupting her PH

## 2018-10-25 LAB — HIV ANTIBODY (ROUTINE TESTING W REFLEX): HIV Screen 4th Generation wRfx: NONREACTIVE

## 2018-10-25 LAB — RPR: RPR Ser Ql: NONREACTIVE

## 2019-03-03 ENCOUNTER — Emergency Department (HOSPITAL_COMMUNITY)
Admission: EM | Admit: 2019-03-03 | Discharge: 2019-03-03 | Disposition: A | Discharge status: Home / Self Care | Payer: Self-pay | Attending: Emergency Medicine | Admitting: Emergency Medicine

## 2019-03-03 ENCOUNTER — Other Ambulatory Visit: Payer: Self-pay

## 2019-03-03 ENCOUNTER — Encounter (HOSPITAL_COMMUNITY): Payer: Self-pay | Admitting: Emergency Medicine

## 2019-03-03 DIAGNOSIS — Z79899 Other long term (current) drug therapy: Secondary | ICD-10-CM | POA: Insufficient documentation

## 2019-03-03 DIAGNOSIS — N12 Tubulo-interstitial nephritis, not specified as acute or chronic: Secondary | ICD-10-CM | POA: Insufficient documentation

## 2019-03-03 DIAGNOSIS — Z87891 Personal history of nicotine dependence: Secondary | ICD-10-CM | POA: Insufficient documentation

## 2019-03-03 DIAGNOSIS — Z91040 Latex allergy status: Secondary | ICD-10-CM | POA: Insufficient documentation

## 2019-03-03 LAB — BASIC METABOLIC PANEL
Anion gap: 10 (ref 5–15)
BUN: 9 mg/dL (ref 6–20)
CO2: 24 mmol/L (ref 22–32)
Calcium: 9.3 mg/dL (ref 8.9–10.3)
Chloride: 104 mmol/L (ref 98–111)
Creatinine, Ser: 1.09 mg/dL — ABNORMAL HIGH (ref 0.44–1.00)
GFR calc Af Amer: >60 mL/min (ref >60)
GFR calc non Af Amer: >60 mL/min (ref >60)
Glucose, Bld: 84 mg/dL (ref 70–99)
Potassium: 3.7 mmol/L (ref 3.5–5.1)
Sodium: 138 mmol/L (ref 135–145)

## 2019-03-03 LAB — URINALYSIS, ROUTINE W REFLEX MICROSCOPIC
Bilirubin Urine: NEGATIVE
Glucose, UA: NEGATIVE mg/dL
Ketones, ur: 20 mg/dL — AB
Nitrite: NEGATIVE
Protein, ur: 30 mg/dL — AB
Specific Gravity, Urine: 1.014 (ref 1.005–1.030)
WBC, UA: >50 WBC/hpf — ABNORMAL HIGH (ref 0–5)
pH: 5 (ref 5.0–8.0)

## 2019-03-03 LAB — CBC
HCT: 39.7 % (ref 36.0–46.0)
Hemoglobin: 13.1 g/dL (ref 12.0–15.0)
MCH: 29.5 pg (ref 26.0–34.0)
MCHC: 33 g/dL (ref 30.0–36.0)
MCV: 89.4 fL (ref 80.0–100.0)
Platelets: 150 10*3/uL (ref 150–400)
RBC: 4.44 MIL/uL (ref 3.87–5.11)
RDW: 12.5 % (ref 11.5–15.5)
WBC: 5 10*3/uL (ref 4.0–10.5)
nRBC: 0 % (ref 0.0–0.2)

## 2019-03-03 LAB — I-STAT BETA HCG BLOOD, ED (MC, WL, AP ONLY): I-stat hCG, quantitative: <5 m[IU]/mL (ref <5)

## 2019-03-03 MED ORDER — CEPHALEXIN 500 MG PO CAPS: 500.0000 mg | ORAL_CAPSULE | Freq: Two times a day (BID) | ORAL | 0 refills | Status: AC

## 2019-03-03 NOTE — ED Provider Notes (Signed)
[facility] Provider [facility]   CSN: [id number] Arrival date & time: 03/03/19  1136     History   Chief Complaint Chief Complaint  Patient presents with  . Flank Pain    HPI Samantha Swanson is a 38 y.o. female with a past medical history of ITP, whp presents today for right flank pain that is aching for the past two weeks.  She said it feels aching.  She reports frequency, urgency, and dysuria.  No radiation.  No fevers, chills, N/V/D/ abd pain. She reports that her alkaline water has been on back order and she hasn't been drinking as much.    She has tried dog wood extract and other herbal remedies with out relief.  She is unsure if she is pregnant.        HPI  Past Medical History:  Diagnosis Date  . Abnormal Pap smear   . Allergy   . Chronic headaches   . Depression    not on meds, ok now  . Genital herpes   . Heart murmur   . ITP (idiopathic thrombocytopenic purpura)   . Migraines   . Ovarian cyst   . Sciatica of right side 11/28/2010  . Thrombocytopenia (HCC)   . UTI (lower urinary tract infection)     Patient Active Problem List   Diagnosis Date Noted  . Syncope 12/25/2012  . Hypoxia 12/25/2012  . Preventative health care 11/28/2010  . Anxiety 11/28/2010  . Sciatica of right side 11/28/2010  . Allergy   . Depression   . Heart murmur   . Chronic headaches   . ITP (idiopathic thrombocytopenic purpura)   . Migraines   . Genital herpes     Past Surgical History:  Procedure Laterality Date  . INDUCED ABORTION     x2     OB History    Gravida  5   Para  1   Term  0   Preterm  1   AB  4   Living  1     SAB  2   TAB  2   Ectopic  0   Multiple  0   Live Births  1            Home Medications    Prior to Admission medications   Medication Sig Start Date End Date Taking? Authorizing Provider  cephALEXin (KEFLEX) 500 MG capsule Take 1 capsule (500 mg total) by mouth 2 (two) times daily for 14  days. 03/03/19 03/17/19  Cristina GongHammond, Janyiah Silveri W, PA-C  ibuprofen (ADVIL,MOTRIN) 600 MG tablet Take 1 tablet (600 mg total) by mouth every 6 (six) hours as needed for cramping. 12/09/17   Katrinka BlazingSmith, [place], CNM  metroNIDAZOLE (FLAGYL) 500 MG tablet Take 1 tablet (500 mg total) by mouth 2 (two) times daily. One po bid x 7 days 10/24/18   Dartha LodgeFord, Kelsey N, PA-C  Prenatal Vit-Fe Fumarate-FA (PRENATAL MULTIVITAMIN) TABS tablet Take 1 tablet by mouth daily at 12 noon.    [provider]  promethazine (PHENERGAN) 25 MG tablet Take 1 tablet (25 mg total) by mouth every 6 (six) hours as needed. 12/09/17   Dorathy KinsmanSmith, [place], CNM    Family History Family History  Problem Relation Age of Onset  . Mental illness Other   . Hypertension Other   . Stroke Other   . Mental illness Other   . Cancer Other        colon cance and lung cancer  .  Heart disease Other   . Mental illness Other   . Anesthesia problems Neg Hx   . Other Neg Hx     Social History Social History   Tobacco Use  . Smoking status: Former Smoker    Packs/day: 0.50    Years: 10.00    Pack years: 5.00    Types: Cigarettes  . Smokeless tobacco: Never Used  Substance Use Topics  . Alcohol use: Not Currently  . Drug use: Not Currently    Frequency: 5.0 times per week    Types: Marijuana    Comment: smokes marijuana last used 03-22-18     Allergies   Latex   Review of Systems Review of Systems  Constitutional: Negative for chills and fever.  Gastrointestinal: Negative for abdominal pain, diarrhea, nausea and vomiting.  Genitourinary: Positive for dysuria, flank pain, frequency and urgency. Negative for hematuria, vaginal bleeding, vaginal discharge and vaginal pain.  Musculoskeletal: Negative for back pain and neck pain.  Neurological: Negative for weakness.  All other systems reviewed and are negative.    Physical Exam Updated Vital Signs BP 96/83   Pulse 66   Temp 98.5 F (36.9 C) (Oral)   Resp 14   Ht 5\' 4"   (1.626 m)   Wt 72.6 kg   LMP  (LMP Unknown)   SpO2 97%   BMI 27.46 kg/m   Physical Exam Vitals signs and nursing note reviewed.  Constitutional:      General: She is not in acute distress.    Appearance: She is well-developed. She is not diaphoretic.     Comments: Well appearing  HENT:     Head: Normocephalic and atraumatic.  Eyes:     General: No scleral icterus.       Right eye: No discharge.        Left eye: No discharge.     Conjunctiva/sclera: Conjunctivae normal.  Neck:     Musculoskeletal: Normal range of motion.  Cardiovascular:     Rate and Rhythm: Normal rate and regular rhythm.  Pulmonary:     Effort: Pulmonary effort is normal. No respiratory distress.     Breath sounds: No stridor.  Abdominal:     General: Abdomen is flat. There is no distension.     Tenderness: There is right CVA tenderness. There is no left CVA tenderness.  Musculoskeletal:        General: No deformity.  Skin:    General: Skin is warm and dry.  Neurological:     Mental Status: She is alert.     Motor: No abnormal muscle tone.  Psychiatric:        Behavior: Behavior normal.      ED Treatments / Results  Labs (all labs ordered are listed, but only abnormal results are displayed) Labs Reviewed  URINALYSIS, ROUTINE W REFLEX MICROSCOPIC - Abnormal; Notable for the following components:      Result Value   APPearance CLOUDY (*)    Hgb urine dipstick SMALL (*)    Ketones, ur 20 (*)    Protein, ur 30 (*)    Leukocytes,Ua LARGE (*)    WBC, UA >50 (*)    Bacteria, UA MANY (*)    All other components within normal limits  BASIC METABOLIC PANEL - Abnormal; Notable for the following components:   Creatinine, Ser 1.09 (*)    All other components within normal limits  URINE CULTURE  CBC  I-STAT BETA HCG BLOOD, ED ([facility], [facility], [facility] ONLY)  EKG None  Radiology No results found.  Procedures Procedures (including critical care time)  Medications Ordered in ED Medications - No data to  display   Initial Impression / Assessment and Plan / ED Course  I have reviewed the triage vital signs and the nursing notes.  Pertinent labs & imaging results that were available during my care of the patient were reviewed by me and considered in my medical decision making (see chart for details).       Patient presents today for evaluation of urinary frequency, urgency, and dysuria.  This is been going on for 2 weeks at home.  She has attempted to treat this at home, however over the past few days she has developed right-sided flank pain.  She is afebrile here.  Not tachycardic or tachypneic, and generally well-appearing.  Abs are obtained and reviewed, she has a slightly elevated creatinine of 1.09, however does not have other significant hematologic or electrolyte derangements.  No leukocytosis.  She is not pregnant.  UA obtained with microscopy showing 6-10 red cells, over 50 white cells with many bacteria, white blood cell clumps and mucus with only 0-5 squamous epithelial cells.  She does have right-sided CVA tenderness to percussion.   She does report that she has not been drinking as much as her alkaline water that she normally drinks has been backordered.  We discussed the importance of maintaining adequate hydration, especially as patient works out frequently.  Suspect pyelonephritis.  She does not have any anterior abdominal pain.  Given her course of symptoms course of 2 weeks, and her general well appearance do not suspect renal stones.    As she is generally well-appearing, not nauseous, afebrile and hemodynamically stable we will treat as an outpatient.  She does not have significant comorbidities. We will treat with Keflex.  Return precautions were discussed with patient who states their understanding.  At the time of discharge patient denied any unaddressed complaints or concerns.  Patient is agreeable for discharge home.   Final Clinical Impressions(s) / ED Diagnoses    Final diagnoses:  Pyelonephritis    ED Discharge Orders         Ordered    cephALEXin (KEFLEX) 500 MG capsule  2 times daily     03/03/19 1503           Cristina Gong, [place] 03/03/19 2313    Arby Barrette, MD 03/12/19 [phone]

## 2019-03-03 NOTE — Discharge Instructions (Addendum)
Please make sure you are drinking plenty of water.  This is especially important when you are working out.  You should be drinking enough water that you are peeing approximately every 2-3 hours and your urine should be light yellow to clear.  You may have diarrhea from the antibiotics.  It is very important that you continue to take the antibiotics even if you get diarrhea unless a medical professional tells you that you may stop taking them.  If you stop too early the bacteria you are being treated for will become stronger and you may need different, more powerful antibiotics that have more side effects and worsening diarrhea.  Please stay well hydrated and consider probiotics as they may decrease the severity of your diarrhea.  Please be aware that if you take any hormonal contraception (birth control pills, nexplanon, the ring, etc) that your birth control will not work while you are taking antibiotics and you need to use back up protection as directed on the birth control medication information insert.

## 2019-03-03 NOTE — ED Notes (Signed)
Patient verbalizes understanding of discharge instructions. Opportunity for questioning and answers were provided. Armband removed by staff, pt discharged from ED ambulatory to hime

## 2019-03-03 NOTE — ED Triage Notes (Signed)
Pt presents with complaints of cloudy urine along with right flank pain for 2 weeks she has used some holistic methods without relief. No blood in urine, dysuria is mild. No fever or nausea has had diarrhea.

## 2019-03-05 LAB — URINE CULTURE
Culture: >=100000 — AB
Special Requests: NORMAL

## 2019-03-06 ENCOUNTER — Telehealth (HOSPITAL_BASED_OUTPATIENT_CLINIC_OR_DEPARTMENT_OTHER): Payer: Self-pay

## 2019-03-06 NOTE — Telephone Encounter (Signed)
Post ED Visit - Positive Culture Follow-up  Culture report reviewed by antimicrobial stewardship pharmacist: [place] Team []  Elenor Quinones, Pharm.D. []  Heide Guile, Pharm.D., BCPS AQ-ID []  Parks Neptune, Pharm.D., BCPS []  Alycia Rossetti, Pharm.D., BCPS []  [place], Pharm.D., BCPS, AAHIVP []  Legrand Como, Pharm.D., BCPS, AAHIVP []  Salome Arnt, PharmD, BCPS []  Johnnette Gourd, PharmD, BCPS []  Hughes Better, PharmD, BCPS []  Leeroy Cha, PharmD []  Laqueta Linden, PharmD, BCPS []  Albertina Parr, PharmD X   Mimi Pham, [place] Team []  Leodis Sias, PharmD []  Lindell Spar, PharmD []  Royetta Asal, PharmD []  Graylin Shiver, Rph []  Rema Fendt) Glennon Mac, PharmD []  Arlyn Dunning, PharmD []  Netta Cedars, PharmD []  Dia Sitter, PharmD []  Leone Haven, PharmD []  Gretta Arab, PharmD []  Theodis Shove, PharmD []  Peggyann Juba, PharmD []  Reuel Boom, PharmD   Positive urine culture, >/= 100,000 colonies E Coli Treated with Cephalexin, organism sensitive to the same and no further patient follow-up is required at this time.  Dortha Kern 03/06/2019, 9:57 AM

## 2019-05-23 NOTE — L&D Delivery Note (Addendum)
Delivery Note Labor onset:   01/07/20 Labor Onset Time: 1305 Complete dilation at 10:34 PM  Onset of pushing at 2230 FHR second stage Cat 2 Analgesia/Anesthesia intrapartum: Epidural  CNM to bedside for variables not resolved w/ amnioinfusion. Pt reports increased rectal and vaginal pressure. Anterior lip reduced w/ guided pushing efforts and maternal urge. Delivery of a viable female requiring gentle axile traction. Both shoulders palpable at introitus, maternal pushing efforts weakened from maternal exhaustion. Birth @ at 2257. Fetal head delivered in LOA position. Nuchal cord none. NICU present for immediate assessment for variables. Infant given to team. Large terminal mec noted. Cord double clamped immediately after birth and cut by CNM. Pt's sister and S/O present for birth. Cord blood sample collected Yes Arterial cord blood sample Yes. Results for AAMORI, MCMASTERS (MRN [id number]) as of 01/07/2020 23:36  Ref. Range 01/07/2020 23:08  pH cord blood (arterial) Latest Ref Range: 7.210 - 7.380  7.117 (LL)  pCO2 cord blood (arterial) Latest Ref Range: 42.0 - 56.0 mmHg 62.6 (H)  Bicarbonate Latest Ref Range: 13.0 - 22.0 mmol/L 19.3    Placenta delivered Schultz, intact, with 3 VC, minimal Wortman's jelly and velamentous cord insertion.  Placenta to pathology. Uterine tone firm bleeding minimal  No laceration identified.  Anesthesia: Epidural Repair: N/A EBL (mL): 250 Complications: Cat 2 fetal surveillance APGAR: APGAR (1 MIN): 4  APGAR (5 MINS):  8 APGAR (10 MINS):   Mom to postpartum.  Baby to Couplet care / Skin to Skin.  Roma Schanz MSN, CNM 01/07/2020, 11:25 PM

## 2019-06-02 ENCOUNTER — Inpatient Hospital Stay (HOSPITAL_COMMUNITY): Payer: Medicaid Other

## 2019-06-02 ENCOUNTER — Encounter (HOSPITAL_COMMUNITY): Payer: Self-pay | Admitting: Obstetrics and Gynecology

## 2019-06-02 ENCOUNTER — Inpatient Hospital Stay (HOSPITAL_COMMUNITY)
Admission: AD | Admit: 2019-06-02 | Discharge: 2019-06-02 | Disposition: A | Discharge status: Home / Self Care | Payer: Medicaid Other | Attending: Obstetrics and Gynecology | Admitting: Obstetrics and Gynecology

## 2019-06-02 ENCOUNTER — Other Ambulatory Visit: Payer: Self-pay

## 2019-06-02 DIAGNOSIS — O26891 Other specified pregnancy related conditions, first trimester: Secondary | ICD-10-CM | POA: Insufficient documentation

## 2019-06-02 DIAGNOSIS — Z3A08 8 weeks gestation of pregnancy: Secondary | ICD-10-CM

## 2019-06-02 DIAGNOSIS — Z3A01 Less than 8 weeks gestation of pregnancy: Secondary | ICD-10-CM

## 2019-06-02 DIAGNOSIS — Z3491 Encounter for supervision of normal pregnancy, unspecified, first trimester: Secondary | ICD-10-CM

## 2019-06-02 DIAGNOSIS — R1030 Lower abdominal pain, unspecified: Secondary | ICD-10-CM | POA: Diagnosis not present

## 2019-06-02 DIAGNOSIS — O26899 Other specified pregnancy related conditions, unspecified trimester: Secondary | ICD-10-CM

## 2019-06-02 DIAGNOSIS — Z87891 Personal history of nicotine dependence: Secondary | ICD-10-CM | POA: Insufficient documentation

## 2019-06-02 DIAGNOSIS — R109 Unspecified abdominal pain: Secondary | ICD-10-CM

## 2019-06-02 HISTORY — DX: Unspecified abnormal cytological findings in specimens from vagina: R87.629

## 2019-06-02 LAB — URINALYSIS, ROUTINE W REFLEX MICROSCOPIC
Bilirubin Urine: NEGATIVE
Glucose, UA: NEGATIVE mg/dL
Hgb urine dipstick: NEGATIVE
Ketones, ur: NEGATIVE mg/dL
Leukocytes,Ua: NEGATIVE
Nitrite: NEGATIVE
Protein, ur: NEGATIVE mg/dL
Specific Gravity, Urine: 1.004 — ABNORMAL LOW (ref 1.005–1.030)
pH: 6 (ref 5.0–8.0)

## 2019-06-02 LAB — CBC
HCT: 37.4 % (ref 36.0–46.0)
Hemoglobin: 12.7 g/dL (ref 12.0–15.0)
MCH: 29.4 pg (ref 26.0–34.0)
MCHC: 34 g/dL (ref 30.0–36.0)
MCV: 86.6 fL (ref 80.0–100.0)
Platelets: DECREASED 10*3/uL (ref 150–400)
RBC: 4.32 MIL/uL (ref 3.87–5.11)
RDW: 12.1 % (ref 11.5–15.5)
WBC: 3.7 10*3/uL — ABNORMAL LOW (ref 4.0–10.5)
nRBC: 0 % (ref 0.0–0.2)

## 2019-06-02 LAB — WET PREP, GENITAL
Clue Cells Wet Prep HPF POC: NONE SEEN
Sperm: NONE SEEN
Trich, Wet Prep: NONE SEEN
Yeast Wet Prep HPF POC: NONE SEEN

## 2019-06-02 LAB — HCG, QUANTITATIVE, PREGNANCY: hCG, Beta Chain, Quant, S: 107578 m[IU]/mL — ABNORMAL HIGH (ref <5)

## 2019-06-02 LAB — POCT PREGNANCY, URINE: Preg Test, Ur: POSITIVE — AB

## 2019-06-02 NOTE — MAU Note (Signed)
Feels a little cramping.  Hx of miscarriage.  Is preg, "about 6 +HPTs."  No bleeding.

## 2019-06-02 NOTE — MAU Provider Note (Signed)
History     CSN: [id number]  Arrival date and time: 06/02/19 1306   First Provider Initiated Contact with Patient 06/02/19 1354      Chief Complaint  Patient presents with  . Abdominal Pain  . Possible Pregnancy   39 y.o. [id number] @[redacted]w[redacted]d  by sure LMP presenting with LAP. Reports bilateral low abdominal cramping that started today. Rates pain 5/10. Has not tried anything for it. Denies VB or discharge. No GI sx. No urinary sx except frequency.    OB History    Gravida  6   Para  1   Term  0   Preterm  1   AB  4   Living  1     SAB  2   TAB  2   Ectopic  0   Multiple  0   Live Births  1           Past Medical History:  Diagnosis Date  . Abnormal Pap smear   . Allergy   . Chronic headaches   . Depression    not on meds, ok now  . Genital herpes   . Heart murmur    no work up  . ITP (idiopathic thrombocytopenic purpura)   . Migraines   . Ovarian cyst   . Sciatica of right side 11/28/2010  . Thrombocytopenia (HCC)   . UTI (lower urinary tract infection)   . Vaginal Pap smear, abnormal    no repeat    Past Surgical History:  Procedure Laterality Date  . INDUCED ABORTION     x2    Family History  Problem Relation Age of Onset  . Mental illness Other   . Hypertension Other   . Stroke Other   . Mental illness Other   . Cancer Other        colon cance and lung cancer  . Heart disease Other   . Mental illness Other   . Hypertension Mother   . Diabetes Mother   . Hypertension Father   . Anesthesia problems Neg Hx   . Other Neg Hx     Social History   Tobacco Use  . Smoking status: Former Smoker    Packs/day: 0.50    Years: 10.00    Pack years: 5.00    Types: Cigarettes  . Smokeless tobacco: Never Used  . Tobacco comment: 2018  Substance Use Topics  . Alcohol use: Not Currently  . Drug use: Not Currently    Frequency: 5.0 times per week    Types: Marijuana    Comment: last  Oct 2020    Allergies:  Allergies  Allergen  Reactions  . Latex Itching    burning    No medications prior to admission.    Review of Systems  Constitutional: Negative for chills and fever.  Gastrointestinal: Positive for abdominal pain. Negative for constipation, diarrhea, nausea and vomiting.  Genitourinary: Positive for frequency. Negative for dysuria, hematuria, urgency, vaginal bleeding and vaginal discharge.   Physical Exam   Blood pressure 108/64, pulse 74, temperature 97.7 F (36.5 C), temperature source Oral, resp. rate 17, height 5\' 4"  (1.626 m), weight 72.8 kg, last menstrual period 04/01/2019, SpO2 100 %, unknown if currently breastfeeding.  Physical Exam  Nursing note and vitals reviewed. Constitutional: She is oriented to person, place, and time. She appears well-developed and well-nourished. No distress.  HENT:  Head: Normocephalic and atraumatic.  Cardiovascular: Normal rate.  Respiratory: Effort normal. No respiratory distress.  GI: Soft. She exhibits  no distension and no mass. There is no abdominal tenderness. There is no rebound and no guarding.  Genitourinary:    Genitourinary Comments: External: no lesions or erythema Vagina: rugated, pink, moist, thin yellow discharge Uterus: + enlarged, anteverted, non tender, no CMT Adnexae: no masses, no tenderness left, no tenderness right Cervix closed    Musculoskeletal:        General: Normal range of motion.     Cervical back: Normal range of motion.  Neurological: She is alert and oriented to person, place, and time.  Skin: Skin is warm and dry.  Psychiatric: She has a normal mood and affect.   Results for orders placed or performed during the hospital encounter of 06/02/19 (from the past 24 hour(s))  Pregnancy, urine POC     Status: Abnormal   Collection Time: 06/02/19  1:24 PM  Result Value Ref Range   Preg Test, Ur POSITIVE (A) NEGATIVE  Urinalysis, Routine w reflex microscopic     Status: Abnormal   Collection Time: 06/02/19  1:27 PM  Result  Value Ref Range   Color, Urine STRAW (A) YELLOW   APPearance CLEAR CLEAR   Specific Gravity, Urine 1.004 (L) 1.005 - 1.030   pH 6.0 5.0 - 8.0   Glucose, UA NEGATIVE NEGATIVE mg/dL   Hgb urine dipstick NEGATIVE NEGATIVE   Bilirubin Urine NEGATIVE NEGATIVE   Ketones, ur NEGATIVE NEGATIVE mg/dL   Protein, ur NEGATIVE NEGATIVE mg/dL   Nitrite NEGATIVE NEGATIVE   Leukocytes,Ua NEGATIVE NEGATIVE  CBC     Status: Abnormal   Collection Time: 06/02/19  1:41 PM  Result Value Ref Range   WBC 3.7 (L) 4.0 - 10.5 K/uL   RBC 4.32 3.87 - 5.11 MIL/uL   Hemoglobin 12.7 12.0 - 15.0 g/dL   HCT 62.8 36.6 - 29.4 %   MCV 86.6 80.0 - 100.0 fL   MCH 29.4 26.0 - 34.0 pg   MCHC 34.0 30.0 - 36.0 g/dL   RDW 76.5 46.5 - 03.5 %   Platelets  150 - 400 K/uL    PLATELET CLUMPS NOTED ON SMEAR, COUNT APPEARS DECREASED   nRBC 0.0 0.0 - 0.2 %  hCG, quantitative, pregnancy     Status: Abnormal   Collection Time: 06/02/19  1:41 PM  Result Value Ref Range   hCG, Beta Chain, Quant, S 107,578 (H) <5 mIU/mL  Wet prep, genital     Status: Abnormal   Collection Time: 06/02/19  2:04 PM   Specimen: PATH Cytology Cervicovaginal Ancillary Only  Result Value Ref Range   Yeast Wet Prep HPF POC NONE SEEN NONE SEEN   Trich, Wet Prep NONE SEEN NONE SEEN   Clue Cells Wet Prep HPF POC NONE SEEN NONE SEEN   WBC, Wet Prep HPF POC MANY (A) NONE SEEN   Sperm NONE SEEN    US OB LESS THAN 14 WEEKS WITH OB TRANSVAGINAL  Result Date: 06/02/2019 CLINICAL DATA:  Cramping. Estimated gestational age by last menstrual period equals 8 weeks 6 days. Positive urine pregnancy test. EXAM: OBSTETRIC <14 WK [place] AND TRANSVAGINAL OB US TECHNIQUE: Both transabdominal and transvaginal ultrasound examinations were performed for complete evaluation of the gestation as well as the maternal uterus, adnexal regions, and pelvic cul-de-sac. Transvaginal technique was performed to assess early pregnancy. COMPARISON:  None. FINDINGS: Intrauterine gestational sac:  Single Yolk sac:  Present Embryo:  Present Cardiac Activity: Present Heart Rate: 162 bpm CRL:  15.0 mm   7 w   5 d  [place] EDC: 01/14/2020 Subchorionic hemorrhage:  None visualized. Maternal uterus/adnexae: Normal ovaries and uterus. Corpus luteal cyst the LEFT ovary IMPRESSION: 1. Single intrauterine gestation with embryo and normal cardiac activity. 2. Estimated gestational age by crown rump length equals 7 weeks 5 days. Electronically Signed   By: Suzy Bouchard M.D.   On: 06/02/2019 14:43   MAU Course  Procedures  MDM Labs and [place] ordered and reviewed. Normal IUP on [place]. Cramping likely physiologic to early pregnancy. Pt reassured. Stable for discharge home.   Assessment and Plan   1. [redacted] weeks gestation of pregnancy   2. Abdominal cramping affecting pregnancy   3. Normal intrauterine pregnancy on prenatal ultrasound in first trimester    Discharge home Follow up with OB provider of choice to start care SAB precautions Start PNV   Allergies as of 06/02/2019      Reactions   Latex Itching   burning      Medication List    You have not been prescribed any medications.     Julianne Handler, CNM 06/02/2019, 3:04 PM

## 2019-06-02 NOTE — Discharge Instructions (Signed)
Abdominal Pain During Pregnancy  Belly (abdominal) pain is common during pregnancy. There are many possible causes. Most of the time, it is not a serious problem. Other times, it can be a sign that something is wrong with the pregnancy. Always tell your doctor if you have belly pain. Follow these instructions at home:  Do not have sex or put anything in your vagina until your pain goes away completely.  Get plenty of rest until your pain gets better.  Drink enough fluid to keep your pee (urine) pale yellow.  Take over-the-counter and prescription medicines only as told by your doctor.  Keep all follow-up visits as told by your doctor. This is important. Contact a doctor if:  Your pain continues or gets worse after resting.  You have lower belly pain that: ? Comes and goes at regular times. ? Spreads to your back. ? Feels like menstrual cramps.  You have pain or burning when you pee (urinate). Get help right away if:  You have a fever or chills.  You have vaginal bleeding.  You are leaking fluid from your vagina.  You are passing tissue from your vagina.  You throw up (vomit) for more than 24 hours.  You have watery poop (diarrhea) for more than 24 hours.  Your baby is moving less than usual.  You feel very weak or faint.  You have shortness of breath.  You have very bad pain in your upper belly. Summary  Belly (abdominal) pain is common during pregnancy. There are many possible causes.  If you have belly pain during pregnancy, tell your doctor right away.  Keep all follow-up visits as told by your doctor. This is important. This information is not intended to replace advice given to you by your health care provider. Make sure you discuss any questions you have with your health care provider. Document Revised: 08/26/2018 Document Reviewed: 08/10/2016 Elsevier Patient Education  2020 [employer]   [facility] Providers    [facility]       Phone: [phone]  [facility]   Phone: [phone]  [facility] [place]  Phone: [phone]  [facility] [place]  Phone: [phone]  [facility] [place] [facility]  Phone: [phone]  [facility]   Phone: [phone]  [facility]       Phone: [phone]  [facility]    Phone: [phone]   [facility]    Phone: [phone]  [facility]    Phone: [phone]  [facility]    Phone: [phone]  [facility]       Phone: [phone]   [facility]  Phone: [phone]  [facility]    Phone: [phone]  [facility]   Phone: [phone]  [facility]      Phone: [phone]  [facility]    Phone: [phone]   

## 2019-06-03 LAB — GC/CHLAMYDIA PROBE AMP (~~LOC~~) NOT AT ARMC
Chlamydia: NEGATIVE
Comment: NEGATIVE
Comment: NORMAL
Neisseria Gonorrhea: NEGATIVE

## 2019-06-07 ENCOUNTER — Inpatient Hospital Stay (HOSPITAL_COMMUNITY): Payer: Medicaid Other

## 2019-06-07 ENCOUNTER — Inpatient Hospital Stay (HOSPITAL_COMMUNITY)
Admission: AD | Admit: 2019-06-07 | Discharge: 2019-06-07 | Disposition: A | Discharge status: Home / Self Care | Payer: Medicaid Other | Attending: Obstetrics and Gynecology | Admitting: Obstetrics and Gynecology

## 2019-06-07 ENCOUNTER — Encounter (HOSPITAL_COMMUNITY): Payer: Self-pay | Admitting: Obstetrics and Gynecology

## 2019-06-07 ENCOUNTER — Other Ambulatory Visit: Payer: Self-pay

## 2019-06-07 DIAGNOSIS — Z3A01 Less than 8 weeks gestation of pregnancy: Secondary | ICD-10-CM | POA: Insufficient documentation

## 2019-06-07 DIAGNOSIS — O09521 Supervision of elderly multigravida, first trimester: Secondary | ICD-10-CM | POA: Diagnosis not present

## 2019-06-07 DIAGNOSIS — Z91040 Latex allergy status: Secondary | ICD-10-CM | POA: Diagnosis not present

## 2019-06-07 DIAGNOSIS — Z3A08 8 weeks gestation of pregnancy: Secondary | ICD-10-CM | POA: Diagnosis not present

## 2019-06-07 DIAGNOSIS — Z8249 Family history of ischemic heart disease and other diseases of the circulatory system: Secondary | ICD-10-CM | POA: Diagnosis not present

## 2019-06-07 DIAGNOSIS — O9A211 Injury, poisoning and certain other consequences of external causes complicating pregnancy, first trimester: Secondary | ICD-10-CM | POA: Diagnosis not present

## 2019-06-07 DIAGNOSIS — O26891 Other specified pregnancy related conditions, first trimester: Secondary | ICD-10-CM | POA: Diagnosis not present

## 2019-06-07 DIAGNOSIS — Z87891 Personal history of nicotine dependence: Secondary | ICD-10-CM | POA: Diagnosis not present

## 2019-06-07 DIAGNOSIS — Z833 Family history of diabetes mellitus: Secondary | ICD-10-CM | POA: Diagnosis not present

## 2019-06-07 DIAGNOSIS — R109 Unspecified abdominal pain: Secondary | ICD-10-CM

## 2019-06-07 DIAGNOSIS — Z3491 Encounter for supervision of normal pregnancy, unspecified, first trimester: Secondary | ICD-10-CM

## 2019-06-07 LAB — URINALYSIS, ROUTINE W REFLEX MICROSCOPIC
Bilirubin Urine: NEGATIVE
Glucose, UA: NEGATIVE mg/dL
Hgb urine dipstick: NEGATIVE
Ketones, ur: NEGATIVE mg/dL
Leukocytes,Ua: NEGATIVE
Nitrite: NEGATIVE
Protein, ur: NEGATIVE mg/dL
Specific Gravity, Urine: 1.009 (ref 1.005–1.030)
pH: 6 (ref 5.0–8.0)

## 2019-06-07 NOTE — Discharge Instructions (Signed)
First Trimester of Pregnancy The first trimester of pregnancy is from week 1 until the end of week 13 (months 1 through 3). A week after a sperm fertilizes an egg, the egg will implant on the wall of the uterus. This embryo will begin to develop into a baby. Genes from you and your partner will form the baby. The female genes will determine whether the baby will be a boy or a girl. At 6-8 weeks, the eyes and face will be formed, and the heartbeat can be seen on ultrasound. At the end of 12 weeks, all the baby's organs will be formed. Now that you are pregnant, you will want to do everything you can to have a healthy baby. Two of the most important things are to get good prenatal care and to follow your health care provider's instructions. Prenatal care is all the medical care you receive before the baby's birth. This care will help prevent, find, and treat any problems during the pregnancy and childbirth. Body changes during your first trimester Your body goes through many changes during pregnancy. The changes vary from woman to woman.  You may gain or lose a couple of pounds at first.  You may feel sick to your stomach (nauseous) and you may throw up (vomit). If the vomiting is uncontrollable, call your health care provider.  You may tire easily.  You may develop headaches that can be relieved by medicines. All medicines should be approved by your health care provider.  You may urinate more often. Painful urination may mean you have a bladder infection.  You may develop heartburn as a result of your pregnancy.  You may develop constipation because certain hormones are causing the muscles that push stool through your intestines to slow down.  You may develop hemorrhoids or swollen veins (varicose veins).  Your breasts may begin to grow larger and become tender. Your nipples may stick out more, and the tissue that surrounds them (areola) may become darker.  Your gums may bleed and may be  sensitive to brushing and flossing.  Dark spots or blotches (chloasma, mask of pregnancy) may develop on your face. This will likely fade after the baby is born.  Your menstrual periods will stop.  You may have a loss of appetite.  You may develop cravings for certain kinds of food.  You may have changes in your emotions from day to day, such as being excited to be pregnant or being concerned that something may go wrong with the pregnancy and baby.  You may have more vivid and strange dreams.  You may have changes in your hair. These can include thickening of your hair, rapid growth, and changes in texture. Some women also have hair loss during or after pregnancy, or hair that feels dry or thin. Your hair will most likely return to normal after your baby is born. What to expect at prenatal visits During a routine prenatal visit:  You will be weighed to make sure you and the baby are growing normally.  Your blood pressure will be taken.  Your abdomen will be measured to track your baby's growth.  The fetal heartbeat will be listened to between weeks 10 and 14 of your pregnancy.  Test results from any previous visits will be discussed. Your health care provider may ask you:  How you are feeling.  If you are feeling the baby move.  If you have had any abnormal symptoms, such as leaking fluid, bleeding, severe headaches, or abdominal   cramping.  If you are using any tobacco products, including cigarettes, chewing tobacco, and electronic cigarettes.  If you have any questions. Other tests that may be performed during your first trimester include:  Blood tests to find your blood type and to check for the presence of any previous infections. The tests will also be used to check for low iron levels (anemia) and protein on red blood cells (Rh antibodies). Depending on your risk factors, or if you previously had diabetes during pregnancy, you may have tests to check for high blood sugar  that affects pregnant women (gestational diabetes).  Urine tests to check for infections, diabetes, or protein in the urine.  An ultrasound to confirm the proper growth and development of the baby.  Fetal screens for spinal cord problems (spina bifida) and Down syndrome.  HIV (human immunodeficiency virus) testing. Routine prenatal testing includes screening for HIV, unless you choose not to have this test.  You may need other tests to make sure you and the baby are doing well. Follow these instructions at home: Medicines  Follow your health care provider's instructions regarding medicine use. Specific medicines may be either safe or unsafe to take during pregnancy.  Take a prenatal vitamin that contains at least 600 micrograms (mcg) of folic acid.  If you develop constipation, try taking a stool softener if your health care provider approves. Eating and drinking   Eat a balanced diet that includes fresh fruits and vegetables, whole grains, good sources of protein such as meat, eggs, or tofu, and low-fat dairy. Your health care provider will help you determine the amount of weight gain that is right for you.  Avoid raw meat and uncooked cheese. These carry germs that can cause birth defects in the baby.  Eating four or five small meals rather than three large meals a day may help relieve nausea and vomiting. If you start to feel nauseous, eating a few soda crackers can be helpful. Drinking liquids between meals, instead of during meals, also seems to help ease nausea and vomiting.  Limit foods that are high in fat and processed sugars, such as fried and sweet foods.  To prevent constipation: ? Eat foods that are high in fiber, such as fresh fruits and vegetables, whole grains, and beans. ? Drink enough fluid to keep your urine clear or pale yellow. Activity  Exercise only as directed by your health care provider. Most women can continue their usual exercise routine during  pregnancy. Try to exercise for 30 minutes at least 5 days a week. Exercising will help you: ? Control your weight. ? Stay in shape. ? Be prepared for labor and delivery.  Experiencing pain or cramping in the lower abdomen or lower back is a good sign that you should stop exercising. Check with your health care provider before continuing with normal exercises.  Try to avoid standing for long periods of time. Move your legs often if you must stand in one place for a long time.  Avoid heavy lifting.  Wear low-heeled shoes and practice good posture.  You may continue to have sex unless your health care provider tells you not to. Relieving pain and discomfort  Wear a good support bra to relieve breast tenderness.  Take warm sitz baths to soothe any pain or discomfort caused by hemorrhoids. Use hemorrhoid cream if your health care provider approves.  Rest with your legs elevated if you have leg cramps or low back pain.  If you develop varicose veins in   your legs, wear support hose. Elevate your feet for 15 minutes, 3-4 times a day. Limit salt in your diet. Prenatal care  Schedule your prenatal visits by the twelfth week of pregnancy. They are usually scheduled monthly at first, then more often in the last 2 months before delivery.  Write down your questions. Take them to your prenatal visits.  Keep all your prenatal visits as told by your health care provider. This is important. Safety  Wear your seat belt at all times when driving.  Make a list of emergency phone numbers, including numbers for family, friends, the hospital, and police and fire departments. General instructions  Ask your health care provider for a referral to a local prenatal education class. Begin classes no later than the beginning of month 6 of your pregnancy.  Ask for help if you have counseling or nutritional needs during pregnancy. Your health care provider can offer advice or refer you to specialists for help  with various needs.  Do not use hot tubs, steam rooms, or saunas.  Do not douche or use tampons or scented sanitary pads.  Do not cross your legs for long periods of time.  Avoid cat litter boxes and soil used by cats. These carry germs that can cause birth defects in the baby and possibly loss of the fetus by miscarriage or stillbirth.  Avoid all smoking, herbs, alcohol, and medicines not prescribed by your health care provider. Chemicals in these products affect the formation and growth of the baby.  Do not use any products that contain nicotine or tobacco, such as cigarettes and e-cigarettes. If you need help quitting, ask your health care provider. You may receive counseling support and other resources to help you quit.  Schedule a dentist appointment. At home, brush your teeth with a soft toothbrush and be gentle when you floss. Contact a health care provider if:  You have dizziness.  You have mild pelvic cramps, pelvic pressure, or nagging pain in the abdominal area.  You have persistent nausea, vomiting, or diarrhea.  You have a bad smelling vaginal discharge.  You have pain when you urinate.  You notice increased swelling in your face, hands, legs, or ankles.  You are exposed to fifth disease or chickenpox.  You are exposed to German measles (rubella) and have never had it. Get help right away if:  You have a fever.  You are leaking fluid from your vagina.  You have spotting or bleeding from your vagina.  You have severe abdominal cramping or pain.  You have rapid weight gain or loss.  You vomit blood or material that looks like coffee grounds.  You develop a severe headache.  You have shortness of breath.  You have any kind of trauma, such as from a fall or a car accident. Summary  The first trimester of pregnancy is from week 1 until the end of week 13 (months 1 through 3).  Your body goes through many changes during pregnancy. The changes vary from  woman to woman.  You will have routine prenatal visits. During those visits, your health care provider will examine you, discuss any test results you may have, and talk with you about how you are feeling. This information is not intended to replace advice given to you by your health care provider. Make sure you discuss any questions you have with your health care provider. Document Revised: 04/20/2017 Document Reviewed: 04/19/2016 Elsevier Patient Education  2020 [employer] Inc.  
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
## 2019-06-30 ENCOUNTER — Other Ambulatory Visit: Payer: Self-pay

## 2019-06-30 ENCOUNTER — Inpatient Hospital Stay (HOSPITAL_COMMUNITY)
Admission: AD | Admit: 2019-06-30 | Discharge: 2019-06-30 | Disposition: A | Discharge status: Home / Self Care | Payer: Medicaid Other | Attending: Obstetrics & Gynecology | Admitting: Obstetrics & Gynecology

## 2019-06-30 ENCOUNTER — Encounter (HOSPITAL_COMMUNITY): Payer: Self-pay | Admitting: Obstetrics & Gynecology

## 2019-06-30 DIAGNOSIS — Z3A11 11 weeks gestation of pregnancy: Secondary | ICD-10-CM | POA: Diagnosis not present

## 2019-06-30 DIAGNOSIS — N898 Other specified noninflammatory disorders of vagina: Secondary | ICD-10-CM | POA: Insufficient documentation

## 2019-06-30 DIAGNOSIS — Z87440 Personal history of urinary (tract) infections: Secondary | ICD-10-CM | POA: Diagnosis not present

## 2019-06-30 DIAGNOSIS — O26891 Other specified pregnancy related conditions, first trimester: Secondary | ICD-10-CM | POA: Insufficient documentation

## 2019-06-30 DIAGNOSIS — Z833 Family history of diabetes mellitus: Secondary | ICD-10-CM | POA: Diagnosis not present

## 2019-06-30 DIAGNOSIS — O09521 Supervision of elderly multigravida, first trimester: Secondary | ICD-10-CM | POA: Diagnosis not present

## 2019-06-30 DIAGNOSIS — Z87891 Personal history of nicotine dependence: Secondary | ICD-10-CM | POA: Insufficient documentation

## 2019-06-30 DIAGNOSIS — Z91040 Latex allergy status: Secondary | ICD-10-CM | POA: Diagnosis not present

## 2019-06-30 DIAGNOSIS — Z8249 Family history of ischemic heart disease and other diseases of the circulatory system: Secondary | ICD-10-CM | POA: Insufficient documentation

## 2019-06-30 LAB — WET PREP, GENITAL
Clue Cells Wet Prep HPF POC: NONE SEEN
Sperm: NONE SEEN
Trich, Wet Prep: NONE SEEN
Yeast Wet Prep HPF POC: NONE SEEN

## 2019-06-30 LAB — URINALYSIS, ROUTINE W REFLEX MICROSCOPIC
Bilirubin Urine: NEGATIVE
Glucose, UA: NEGATIVE mg/dL
Hgb urine dipstick: NEGATIVE
Ketones, ur: NEGATIVE mg/dL
Leukocytes,Ua: NEGATIVE
Nitrite: NEGATIVE
Protein, ur: NEGATIVE mg/dL
Specific Gravity, Urine: 1.014 (ref 1.005–1.030)
pH: 7 (ref 5.0–8.0)

## 2019-06-30 MED ORDER — ACETAMINOPHEN 500 MG PO TABS: 1000.0000 mg | ORAL_TABLET | Freq: Once | ORAL | Status: DC

## 2019-06-30 NOTE — MAU Provider Note (Signed)
History     CSN: [id number]  Arrival date and time: 06/30/19 1654   First Provider Initiated Contact with Patient 06/30/19 2034      Chief Complaint  Patient presents with  . Vaginal Discharge   Samantha Swanson is a 39 y.o. G6P1 at [redacted]w[redacted]d who presents to MAU with complaints of vaginal discharge. She reports being at work this afternoon and when she was about to get off - went to the bathroom and noticed pinkish discharge when she wiped. She denies vaginal discharge happening again after that one time occurrence. She denies vaginal bleeding - reports hx of miscarriage last year. She denies abdominal pain, back pain, urinary symptoms, or other concerns.   Patient reports having an appointment with [facility] scheduled for this month.   OB History    Gravida  6   Para  1   Term  0   Preterm  1   AB  4   Living  1     SAB  2   TAB  2   Ectopic  0   Multiple  0   Live Births  1           Past Medical History:  Diagnosis Date  . Abnormal Pap smear   . Allergy   . Chronic headaches   . Depression    not on meds, ok now  . Genital herpes   . Heart murmur    no work up  . ITP (idiopathic thrombocytopenic purpura)   . Migraines   . Ovarian cyst   . Sciatica of right side 11/28/2010  . Thrombocytopenia (HCC)   . UTI (lower urinary tract infection)   . Vaginal Pap smear, abnormal    no repeat    Past Surgical History:  Procedure Laterality Date  . INDUCED ABORTION     x2    Family History  Problem Relation Age of Onset  . Mental illness Other   . Hypertension Other   . Stroke Other   . Mental illness Other   . Cancer Other        colon cance and lung cancer  . Heart disease Other   . Mental illness Other   . Hypertension Mother   . Diabetes Mother   . Hypertension Father   . Anesthesia problems Neg Hx   . Other Neg Hx     Social History   Tobacco Use  . Smoking status: Former Smoker    Packs/day: 0.50    Years: 10.00    Pack years: 5.00   Types: Cigarettes  . Smokeless tobacco: Never Used  . Tobacco comment: 2018  Substance Use Topics  . Alcohol use: Not Currently  . Drug use: Not Currently    Frequency: 5.0 times per week    Types: Marijuana    Comment: last  Oct 2020    Allergies:  Allergies  Allergen Reactions  . Latex Itching    burning    No medications prior to admission.    Review of Systems  Constitutional: Negative.   Respiratory: Negative.   Cardiovascular: Negative.   Gastrointestinal: Negative.   Genitourinary: Positive for vaginal discharge. Negative for difficulty urinating, dysuria, frequency, pelvic pain, urgency and vaginal bleeding.  Musculoskeletal: Negative.   Neurological: Negative.   Psychiatric/Behavioral: Negative.    Physical Exam   Blood pressure 119/65, pulse 83, temperature 98.3 F (36.8 C), temperature source Oral, resp. rate 16, height 5\' 4"  (1.626 m), weight 76.6 kg, last menstrual  period 04/01/2019, SpO2 100 %, unknown if currently breastfeeding.  Physical Exam  Constitutional: She is oriented to person, place, and time. She appears well-developed and well-nourished. No distress.  Cardiovascular: Normal rate and regular rhythm.  Respiratory: Effort normal and breath sounds normal. No respiratory distress. She has no wheezes.  GI: Soft. She exhibits no distension. There is no abdominal tenderness. There is no rebound.  Musculoskeletal:        General: No edema. Normal range of motion.  Neurological: She is alert and oriented to person, place, and time.  Psychiatric: She has a normal mood and affect. Her behavior is normal. Thought content normal.    Bedside US completed  Pt informed that the ultrasound is considered a limited OB ultrasound and is not intended to be a complete ultrasound exam.  Patient also informed that the ultrasound is not being completed with the intent of assessing for fetal or placental anomalies or any pelvic abnormalities.  Explained that the  purpose of today's ultrasound is to assess for  viability.  Patient acknowledges the purpose of the exam and the limitations of the study.    FHR 159 by [place], movement seen on [place], no abnormalities   MAU Course  Procedures  MDM Orders Placed This Encounter  Procedures  . Wet prep, genital  . Urinalysis, Routine w reflex microscopic  GC/C pending upon discharge   Wet prep negative  Pelvic examination negative  UA negative   Discussed reasons to return to MAU. Follow up as scheduled in the office. 1st trimester precautions discussed. Return to MAU as needed. Pt stable at time of discharge. Patient discharged at 2100.    Assessment and Plan   1. Vaginal discharge during pregnancy in first trimester   2. [redacted] weeks gestation of pregnancy    Discharge home Follow up as scheduled in the office for prenatal care Return to MAU as needed for reasons discussed and/or emergencies   Follow-up Information    [facility] [place] Follow up.   Specialty: Obstetrics and Gynecology Why: Follow up as scheduled for prenatal care Contact information: [address] [place] [address] [phone]            [place] 07/01/2019, 5:45 AM

## 2019-06-30 NOTE — MAU Note (Signed)
Just had some funking looking d/c, has had a miscarriage before, so wanted to make sure everything was ok. Pinkish red streak in d/c noted when wiped.  No active bleeding and no pain.

## 2019-07-01 LAB — GC/CHLAMYDIA PROBE AMP (~~LOC~~) NOT AT ARMC
Chlamydia: NEGATIVE
Comment: NEGATIVE
Comment: NORMAL
Neisseria Gonorrhea: NEGATIVE

## 2019-07-11 LAB — OB RESULTS CONSOLE HEPATITIS B SURFACE ANTIGEN: Hepatitis B Surface Ag: NEGATIVE

## 2019-07-15 LAB — OB RESULTS CONSOLE HIV ANTIBODY (ROUTINE TESTING): HIV: NONREACTIVE

## 2019-07-15 LAB — OB RESULTS CONSOLE RUBELLA ANTIBODY, IGM: Rubella: IMMUNE

## 2019-07-15 LAB — OB RESULTS CONSOLE HEPATITIS B SURFACE ANTIGEN
Hepatitis B Surface Ag: NEGATIVE
Hepatitis B Surface Ag: NEGATIVE
Hepatitis B Surface Ag: POSITIVE

## 2019-07-16 ENCOUNTER — Other Ambulatory Visit (HOSPITAL_COMMUNITY): Payer: Self-pay | Admitting: Obstetrics and Gynecology

## 2019-07-16 DIAGNOSIS — Z3689 Encounter for other specified antenatal screening: Secondary | ICD-10-CM

## 2019-07-16 LAB — OB RESULTS CONSOLE GC/CHLAMYDIA: Gonorrhea: NEGATIVE

## 2019-08-12 IMAGING — US US OB < 14 WEEKS - US OB TV
1 series · 15 of 28 positions shown · non-contrast
Comparison: Pelvic ultrasound dated 09/20/2010.

CLINICAL DATA: Vaginal bleeding. Seven weeks and 2 days pregnant by
last menstrual period. Quantitative beta HCG [DATE].

EXAM:
OBSTETRIC <14 WK ULTRASOUND
TECHNIQUE: Transabdominal ultrasound was performed for evaluation of the
gestation as well as the maternal uterus and adnexal regions.

[Series 1: us ob < 14 weeks - us ob tv · 70 acquisitions, 15 frames shown]
[im 1/70]
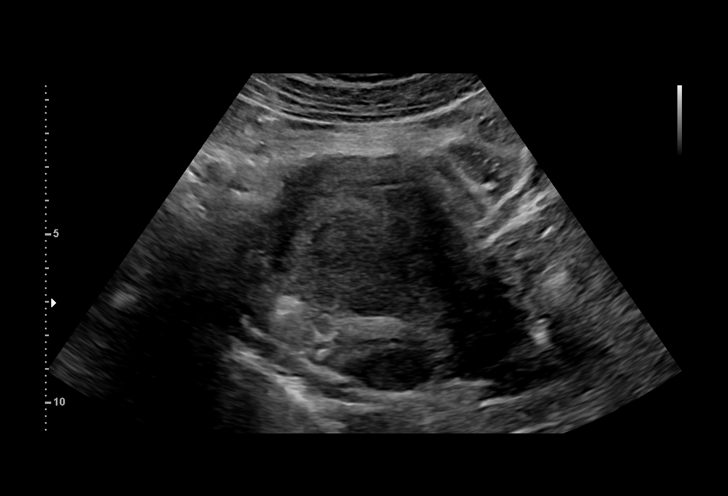
[im 6/70]
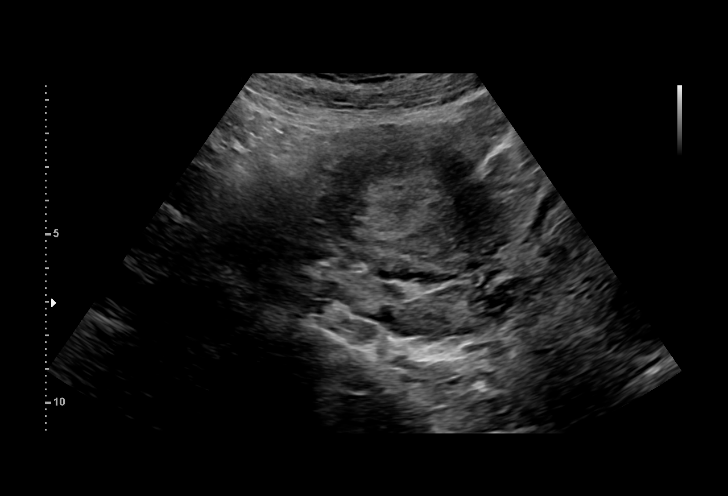
[im 11/70]
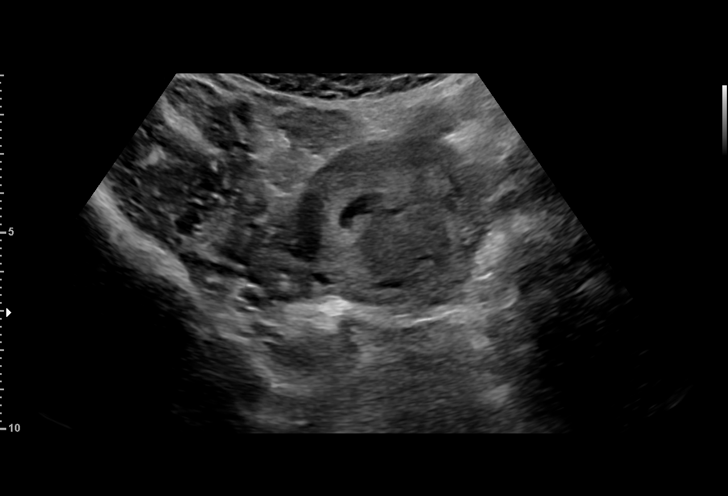
[im 16/70]
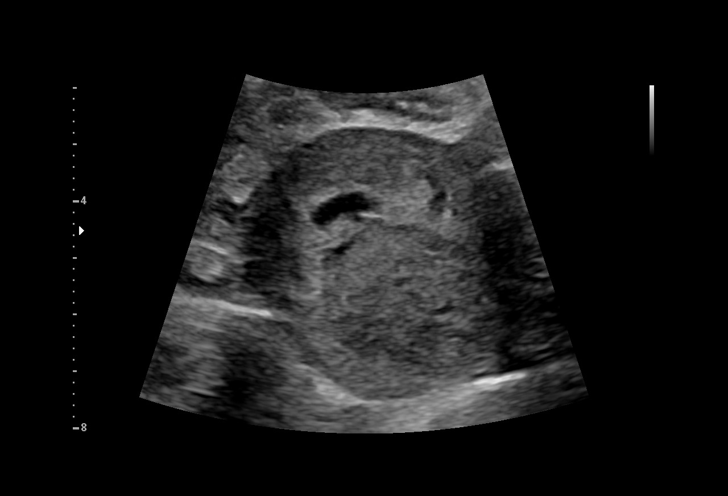
[im 21/70]
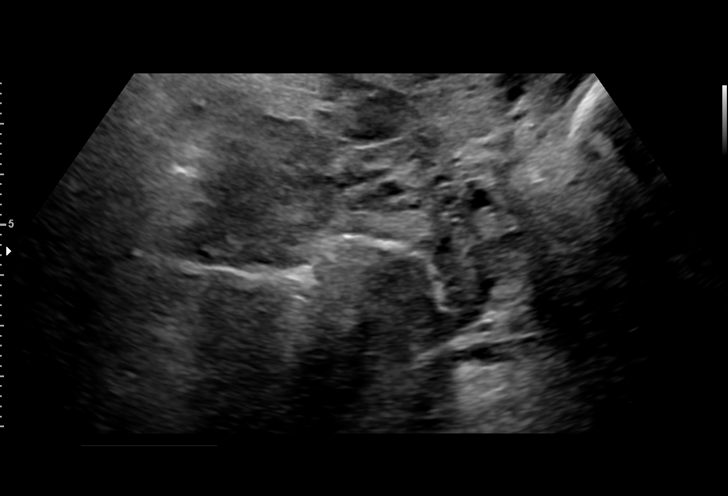
[im 26/70]
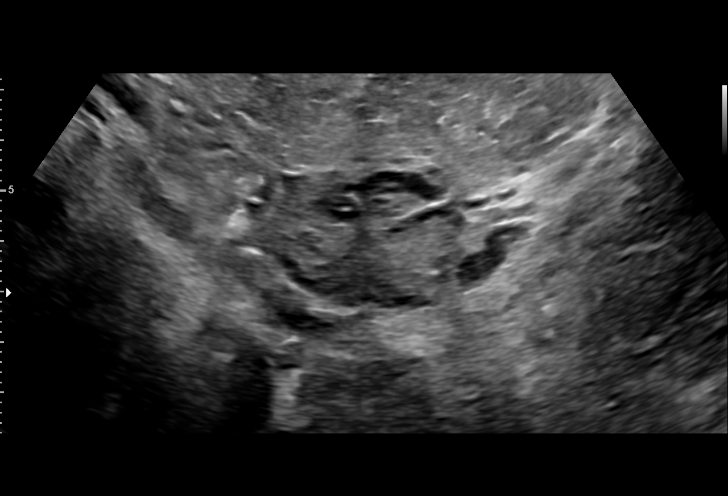
[im 31/70]
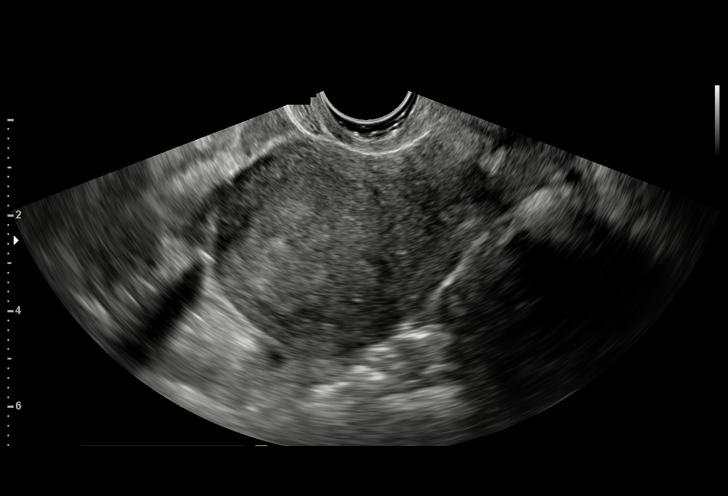
[im 36/70]
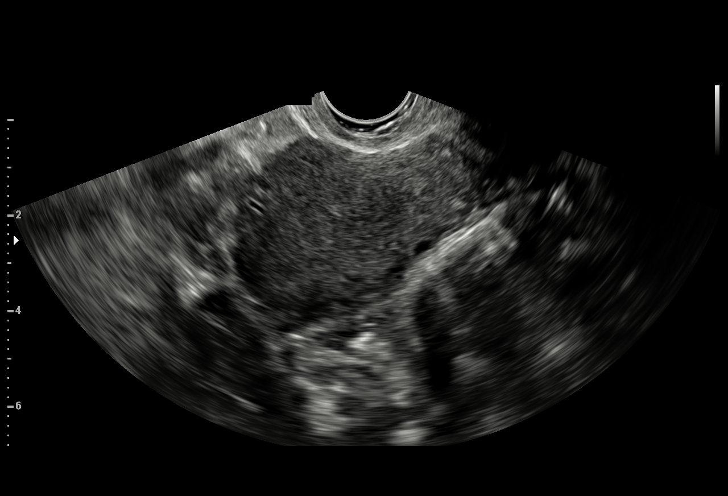
[im 39/70]
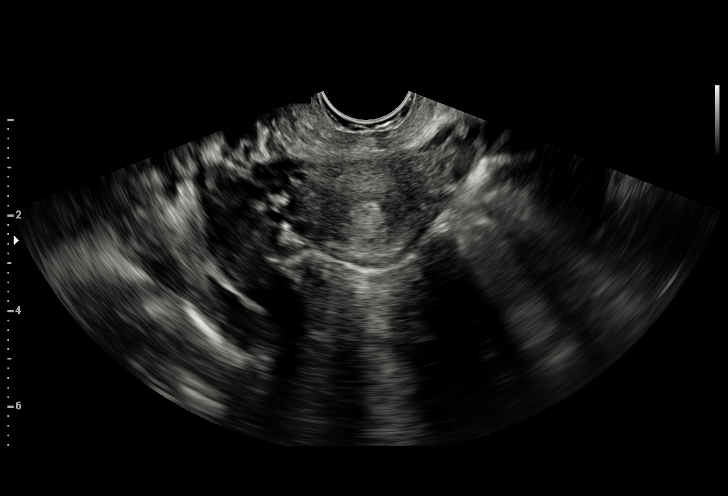
[im 44/70]
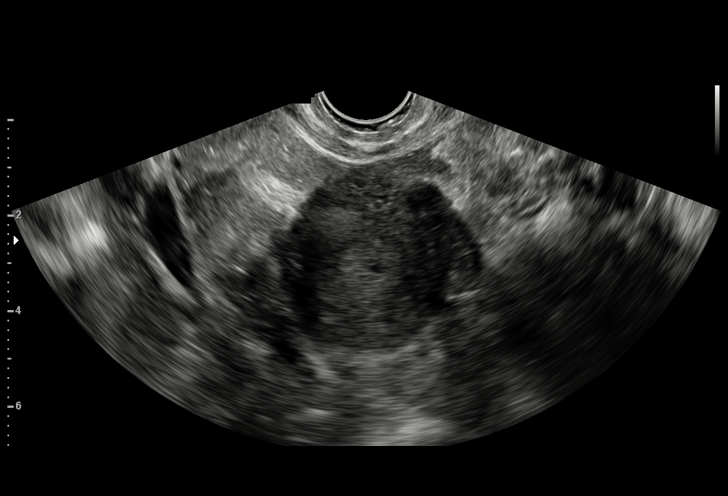
[im 49/70]
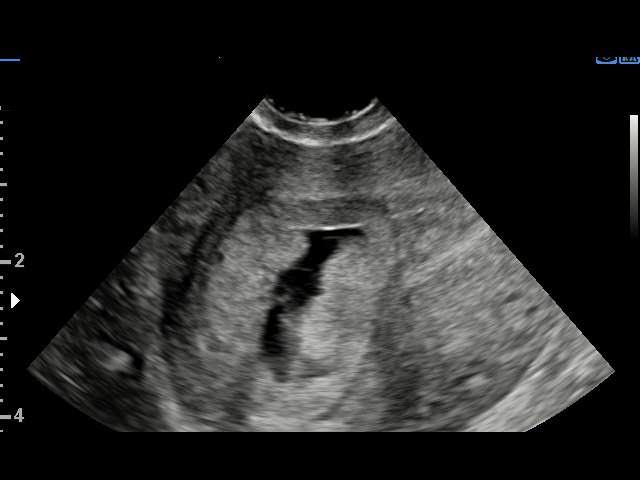
[im 54/70]
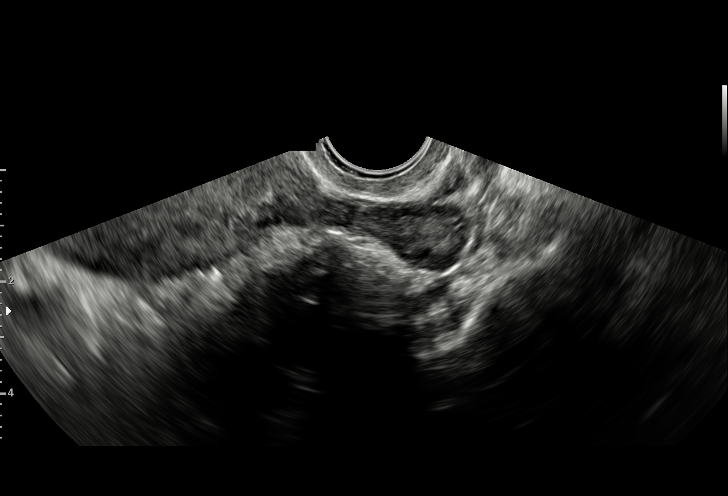
[im 59/70]
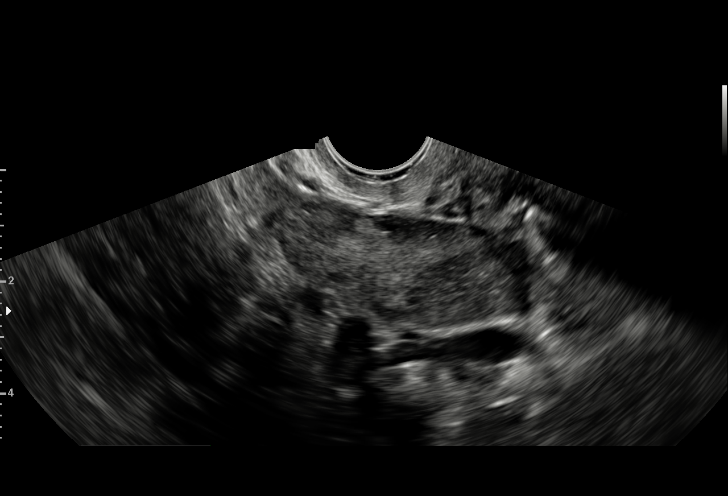
[im 64/70]
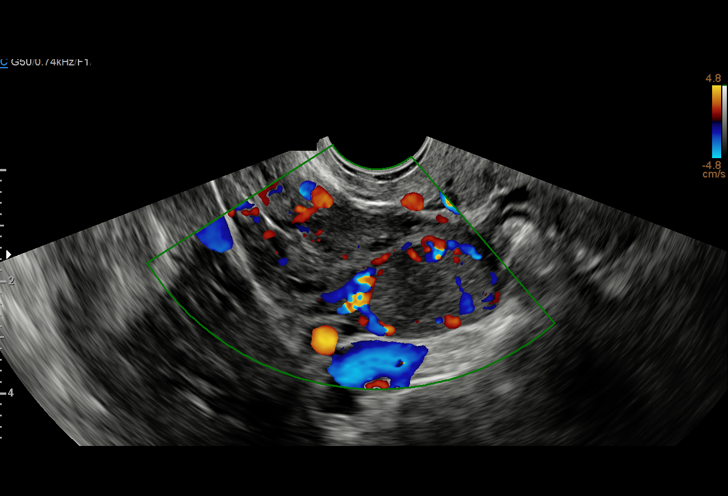
[im 70/70]
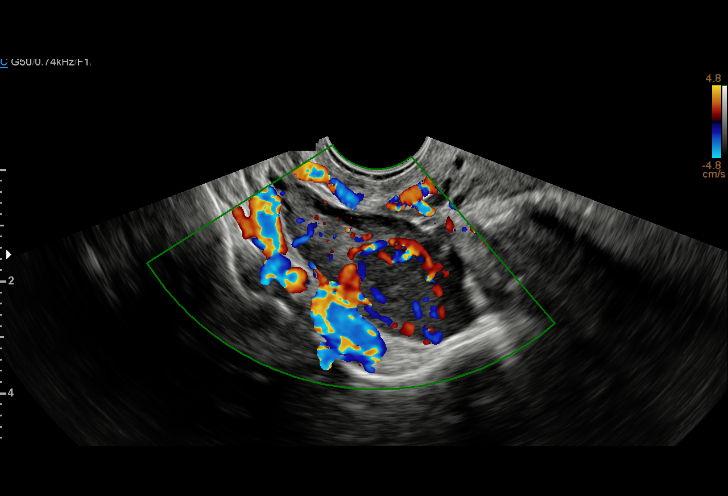

[15 of 28 positions shown; findings below may reference images not displayed]

FINDINGS: Intrauterine gestational sac: Visualized. The sac is irregular and
contains floating internal debris.

Yolk sac:  Not visualized

Embryo:  Not visualized

MSD: 12.9 mm   6 w   0 d

Subchorionic hemorrhage:  None visualized.

Maternal uterus/adnexae: Right ovarian corpus luteum. Normal
appearing left ovary. No adnexal mass. Trace amount of free
peritoneal fluid.
IMPRESSION: Irregular fluid collection containing floating internal debris in
the endometrial cavity with no yolk sac or fetal pole and giving an
estimated gestational age by mean sac diameter of 6 weeks and 0
days. This is suspicious for an abnormal, irregular gestational sac.
Findings are suspicious but not yet definitive for failed pregnancy.
Recommend follow-up serial quantitative beta HCG determinations and
US in 10-14 days for definitive diagnosis. This recommendation
follows SRU consensus guidelines: Diagnostic Criteria for Nonviable
Pregnancy Early in the First Trimester. N Engl J Med 0485;
[DATE]. Findings meet definitive criteria for failed pregnancy.
This follows SRU consensus guidelines: Diagnostic Criteria for
Nonviable Pregnancy Early in the First Trimester. N Engl J Med

## 2019-08-19 ENCOUNTER — Ambulatory Visit (HOSPITAL_COMMUNITY): Payer: Medicaid Other | Admitting: *Deleted

## 2019-08-19 ENCOUNTER — Encounter (HOSPITAL_COMMUNITY): Payer: Self-pay

## 2019-08-19 ENCOUNTER — Other Ambulatory Visit: Payer: Self-pay

## 2019-08-19 ENCOUNTER — Ambulatory Visit (HOSPITAL_COMMUNITY)
Admission: RE | Admit: 2019-08-19 | Discharge: 2019-08-19 | Disposition: A | Discharge status: Home / Self Care | Payer: Medicaid Other | Source: Ambulatory Visit | Attending: Obstetrics and Gynecology | Admitting: Obstetrics and Gynecology

## 2019-08-19 ENCOUNTER — Encounter (HOSPITAL_COMMUNITY): Payer: Self-pay | Admitting: Obstetrics & Gynecology

## 2019-08-19 ENCOUNTER — Other Ambulatory Visit (HOSPITAL_COMMUNITY): Payer: Self-pay | Admitting: *Deleted

## 2019-08-19 VITALS — BP 112/60 | HR 87 | Temp 97.5°F

## 2019-08-19 DIAGNOSIS — O09219 Supervision of pregnancy with history of pre-term labor, unspecified trimester: Secondary | ICD-10-CM | POA: Diagnosis not present

## 2019-08-19 DIAGNOSIS — Z363 Encounter for antenatal screening for malformations: Secondary | ICD-10-CM | POA: Diagnosis not present

## 2019-08-19 DIAGNOSIS — O09522 Supervision of elderly multigravida, second trimester: Secondary | ICD-10-CM

## 2019-08-19 DIAGNOSIS — O09523 Supervision of elderly multigravida, third trimester: Secondary | ICD-10-CM | POA: Diagnosis not present

## 2019-08-19 DIAGNOSIS — Z3689 Encounter for other specified antenatal screening: Secondary | ICD-10-CM | POA: Diagnosis present

## 2019-08-19 DIAGNOSIS — O2690 Pregnancy related conditions, unspecified, unspecified trimester: Secondary | ICD-10-CM | POA: Diagnosis not present

## 2019-08-19 DIAGNOSIS — Z3A18 18 weeks gestation of pregnancy: Secondary | ICD-10-CM

## 2019-09-16 ENCOUNTER — Ambulatory Visit (HOSPITAL_COMMUNITY): Payer: Medicaid Other | Admitting: *Deleted

## 2019-09-16 ENCOUNTER — Encounter (HOSPITAL_COMMUNITY): Payer: Self-pay

## 2019-09-16 ENCOUNTER — Other Ambulatory Visit: Payer: Self-pay

## 2019-09-16 ENCOUNTER — Ambulatory Visit (HOSPITAL_COMMUNITY)
Admission: RE | Admit: 2019-09-16 | Discharge: 2019-09-16 | Disposition: A | Discharge status: Home / Self Care | Payer: Medicaid Other | Source: Ambulatory Visit | Attending: Obstetrics and Gynecology | Admitting: Obstetrics and Gynecology

## 2019-09-16 VITALS — BP 109/60 | HR 81 | Temp 97.1°F

## 2019-09-16 DIAGNOSIS — O2692 Pregnancy related conditions, unspecified, second trimester: Secondary | ICD-10-CM

## 2019-09-16 DIAGNOSIS — O09522 Supervision of elderly multigravida, second trimester: Secondary | ICD-10-CM | POA: Insufficient documentation

## 2019-09-16 DIAGNOSIS — O09212 Supervision of pregnancy with history of pre-term labor, second trimester: Secondary | ICD-10-CM

## 2019-09-16 DIAGNOSIS — Z363 Encounter for antenatal screening for malformations: Secondary | ICD-10-CM

## 2019-09-16 DIAGNOSIS — Z3A22 22 weeks gestation of pregnancy: Secondary | ICD-10-CM

## 2019-12-16 LAB — OB RESULTS CONSOLE GBS: GBS: POSITIVE

## 2020-01-01 ENCOUNTER — Inpatient Hospital Stay (HOSPITAL_COMMUNITY)
Admission: AD | Admit: 2020-01-01 | Discharge: 2020-01-01 | Disposition: A | Discharge status: Home / Self Care | Payer: Medicaid Other | Attending: Obstetrics and Gynecology | Admitting: Obstetrics and Gynecology

## 2020-01-01 ENCOUNTER — Encounter (HOSPITAL_COMMUNITY): Payer: Self-pay | Admitting: Obstetrics and Gynecology

## 2020-01-01 ENCOUNTER — Other Ambulatory Visit: Payer: Self-pay

## 2020-01-01 DIAGNOSIS — O471 False labor at or after 37 completed weeks of gestation: Secondary | ICD-10-CM

## 2020-01-01 DIAGNOSIS — Z3A38 38 weeks gestation of pregnancy: Secondary | ICD-10-CM | POA: Diagnosis not present

## 2020-01-01 DIAGNOSIS — O479 False labor, unspecified: Secondary | ICD-10-CM

## 2020-01-01 NOTE — MAU Provider Note (Signed)
S: Patient is here for RN labor evaluation. Strip, vital signs, & chart Reviewed   O:  Vitals:   01/01/20 0917 01/01/20 0957  BP: 128/75 118/72  Pulse: 91 84  Resp: 18 16  Temp: 97.9 F (36.6 C)   TempSrc: Oral   SpO2: 100%   Weight: 97 kg   Height: 5\' 4"  (1.626 m)    No results found for this or any previous visit (from the past 24 hour(s)).  Dilation: 1 Effacement (%): Thick Cervical Position: Posterior Station: -2 Presentation: Vertex Exam by:: [email or web address], RN   FHR: 145 bpm, Mod Var, no Decels, 15x15 Accels UC: Q2-5 minutes   A: 1. False labor   2. [redacted] weeks gestation of pregnancy      P:  RN to discharge home in stable condition with return precautions & fetal kick counts  Leafy Ro FNP 10:26 AM

## 2020-01-01 NOTE — Discharge Instructions (Signed)

## 2020-01-01 NOTE — MAU Note (Signed)
Lost plug yesterday, started having ctx and pressure yesterday.   This morning having ctxs every 10 min.  No LOF, still having some bloody mucous. Was closed on Tue.  Baby is active

## 2020-01-06 ENCOUNTER — Other Ambulatory Visit: Payer: Self-pay | Admitting: Obstetrics & Gynecology

## 2020-01-07 ENCOUNTER — Inpatient Hospital Stay (HOSPITAL_COMMUNITY): Payer: Medicaid Other | Admitting: Anesthesiology

## 2020-01-07 ENCOUNTER — Other Ambulatory Visit: Payer: Self-pay

## 2020-01-07 ENCOUNTER — Encounter (HOSPITAL_COMMUNITY): Payer: Self-pay | Admitting: Obstetrics and Gynecology

## 2020-01-07 ENCOUNTER — Inpatient Hospital Stay (HOSPITAL_COMMUNITY)
Admission: AD | Admit: 2020-01-07 | Discharge: 2020-01-10 | DRG: 806 | Disposition: A | Discharge status: Home / Self Care | Payer: Medicaid Other | Attending: Obstetrics & Gynecology | Admitting: Obstetrics & Gynecology

## 2020-01-07 DIAGNOSIS — O99824 Streptococcus B carrier state complicating childbirth: Secondary | ICD-10-CM | POA: Diagnosis present

## 2020-01-07 DIAGNOSIS — Z20822 Contact with and (suspected) exposure to covid-19: Secondary | ICD-10-CM | POA: Diagnosis present

## 2020-01-07 DIAGNOSIS — O99214 Obesity complicating childbirth: Secondary | ICD-10-CM | POA: Diagnosis present

## 2020-01-07 DIAGNOSIS — B009 Herpesviral infection, unspecified: Secondary | ICD-10-CM | POA: Diagnosis present

## 2020-01-07 DIAGNOSIS — D693 Immune thrombocytopenic purpura: Secondary | ICD-10-CM | POA: Diagnosis present

## 2020-01-07 DIAGNOSIS — O9832 Other infections with a predominantly sexual mode of transmission complicating childbirth: Secondary | ICD-10-CM | POA: Diagnosis present

## 2020-01-07 DIAGNOSIS — O480 Post-term pregnancy: Principal | ICD-10-CM | POA: Diagnosis present

## 2020-01-07 DIAGNOSIS — Z87891 Personal history of nicotine dependence: Secondary | ICD-10-CM | POA: Diagnosis not present

## 2020-01-07 DIAGNOSIS — Z3A39 39 weeks gestation of pregnancy: Secondary | ICD-10-CM | POA: Diagnosis not present

## 2020-01-07 DIAGNOSIS — O43123 Velamentous insertion of umbilical cord, third trimester: Secondary | ICD-10-CM | POA: Diagnosis present

## 2020-01-07 DIAGNOSIS — E669 Obesity, unspecified: Secondary | ICD-10-CM | POA: Diagnosis present

## 2020-01-07 DIAGNOSIS — O2243 Hemorrhoids in pregnancy, third trimester: Secondary | ICD-10-CM | POA: Diagnosis present

## 2020-01-07 DIAGNOSIS — A6000 Herpesviral infection of urogenital system, unspecified: Secondary | ICD-10-CM | POA: Diagnosis present

## 2020-01-07 DIAGNOSIS — O9912 Other diseases of the blood and blood-forming organs and certain disorders involving the immune mechanism complicating childbirth: Secondary | ICD-10-CM | POA: Diagnosis present

## 2020-01-07 DIAGNOSIS — Z91040 Latex allergy status: Secondary | ICD-10-CM | POA: Diagnosis not present

## 2020-01-07 DIAGNOSIS — O26893 Other specified pregnancy related conditions, third trimester: Secondary | ICD-10-CM | POA: Diagnosis present

## 2020-01-07 HISTORY — DX: Family history of other specified conditions: Z84.89

## 2020-01-07 LAB — CBC
HCT: 36.4 % (ref 36.0–46.0)
HCT: 35.7 % — ABNORMAL LOW (ref 36.0–46.0)
Hemoglobin: 11.9 g/dL — ABNORMAL LOW (ref 12.0–15.0)
Hemoglobin: 11.8 g/dL — ABNORMAL LOW (ref 12.0–15.0)
MCH: 28.2 pg (ref 26.0–34.0)
MCH: 28.6 pg (ref 26.0–34.0)
MCHC: 32.7 g/dL (ref 30.0–36.0)
MCHC: 33.1 g/dL (ref 30.0–36.0)
MCV: 86.3 fL (ref 80.0–100.0)
MCV: 86.7 fL (ref 80.0–100.0)
Platelets: 194 10*3/uL (ref 150–400)
Platelets: 194 10*3/uL (ref 150–400)
RBC: 4.22 MIL/uL (ref 3.87–5.11)
RBC: 4.12 MIL/uL (ref 3.87–5.11)
RDW: 14.5 % (ref 11.5–15.5)
RDW: 14.6 % (ref 11.5–15.5)
WBC: 5.7 10*3/uL (ref 4.0–10.5)
WBC: 9 10*3/uL (ref 4.0–10.5)
nRBC: 0 % (ref 0.0–0.2)
nRBC: 0 % (ref 0.0–0.2)

## 2020-01-07 LAB — TYPE AND SCREEN
ABO/RH(D): O POS
Antibody Screen: NEGATIVE

## 2020-01-07 LAB — SARS CORONAVIRUS 2 BY RT PCR (HOSPITAL ORDER, PERFORMED IN ~~LOC~~ HOSPITAL LAB): SARS Coronavirus 2: NEGATIVE

## 2020-01-07 MED ORDER — OXYCODONE-ACETAMINOPHEN 5-325 MG PO TABS: 2.0000 | ORAL_TABLET | ORAL | Status: DC | PRN

## 2020-01-07 MED ORDER — SODIUM CHLORIDE 0.9 % IV SOLN
5.0000 10*6.[IU] | Freq: Once | INTRAVENOUS | Status: AC
  Administered 2020-01-07: 5 10*6.[IU] via INTRAVENOUS
  Filled 2020-01-07: qty 5

## 2020-01-07 MED ORDER — LACTATED RINGERS AMNIOINFUSION: INTRAVENOUS | Status: DC

## 2020-01-07 MED ORDER — ACETAMINOPHEN 325 MG PO TABS
650.0000 mg | ORAL_TABLET | ORAL | Status: DC | PRN
Start: 2020-01-07 — End: 2020-01-07

## 2020-01-07 MED ORDER — TERBUTALINE SULFATE 1 MG/ML IJ SOLN
0.2500 mg | Freq: Once | INTRAMUSCULAR | Status: DC | PRN
  Filled 2020-01-07: qty 1

## 2020-01-07 MED ORDER — PENICILLIN G POTASSIUM 5000000 UNITS IJ SOLR: 5.0000 10*6.[IU] | Freq: Once | INTRAMUSCULAR | Status: DC

## 2020-01-07 MED ORDER — SODIUM CHLORIDE (PF) 0.9 % IJ SOLN
INTRAMUSCULAR | Status: DC | PRN
  Administered 2020-01-07: 12 mL/h via EPIDURAL

## 2020-01-07 MED ORDER — PENICILLIN G POT IN DEXTROSE 60000 UNIT/ML IV SOLN
3.0000 10*6.[IU] | INTRAVENOUS | Status: DC
Start: 2020-01-07 — End: 2020-01-07

## 2020-01-07 MED ORDER — PHENYLEPHRINE 40 MCG/ML (10ML) SYRINGE FOR IV PUSH (FOR BLOOD PRESSURE SUPPORT): 80.0000 ug | PREFILLED_SYRINGE | INTRAVENOUS | Status: DC | PRN

## 2020-01-07 MED ORDER — LACTATED RINGERS IV SOLN
500.0000 mL | Freq: Once | INTRAVENOUS | Status: AC
  Administered 2020-01-07 (×2): 500 mL via INTRAVENOUS

## 2020-01-07 MED ORDER — FENTANYL-BUPIVACAINE-NACL 0.5-0.125-0.9 MG/250ML-% EP SOLN
12.0000 mL/h | EPIDURAL | Status: DC | PRN
  Filled 2020-01-07: qty 250

## 2020-01-07 MED ORDER — LACTATED RINGERS IV SOLN: INTRAVENOUS | Status: DC

## 2020-01-07 MED ORDER — OXYTOCIN BOLUS FROM INFUSION: 333.0000 mL | Freq: Once | INTRAVENOUS | Status: DC

## 2020-01-07 MED ORDER — EPHEDRINE 5 MG/ML INJ
10.0000 mg | INTRAVENOUS | Status: DC | PRN
Start: 2020-01-07 — End: 2020-01-07

## 2020-01-07 MED ORDER — PHENYLEPHRINE 40 MCG/ML (10ML) SYRINGE FOR IV PUSH (FOR BLOOD PRESSURE SUPPORT)
80.0000 ug | PREFILLED_SYRINGE | INTRAVENOUS | Status: DC | PRN
Start: 2020-01-07 — End: 2020-01-07

## 2020-01-07 MED ORDER — LIDOCAINE HCL (PF) 1 % IJ SOLN
INTRAMUSCULAR | Status: DC | PRN
  Administered 2020-01-07 (×3): 5 mL via EPIDURAL
  Administered 2020-01-07: 4 mL via EPIDURAL

## 2020-01-07 MED ORDER — EPHEDRINE 5 MG/ML INJ: 10.0000 mg | INTRAVENOUS | Status: DC | PRN

## 2020-01-07 MED ORDER — LIDOCAINE HCL (PF) 1 % IJ SOLN: 30.0000 mL | INTRAMUSCULAR | Status: DC | PRN

## 2020-01-07 MED ORDER — FENTANYL CITRATE (PF) 100 MCG/2ML IJ SOLN: 50.0000 ug | INTRAMUSCULAR | Status: DC | PRN

## 2020-01-07 MED ORDER — LACTATED RINGERS IV SOLN: 500.0000 mL | INTRAVENOUS | Status: DC | PRN

## 2020-01-07 MED ORDER — OXYTOCIN-SODIUM CHLORIDE 30-0.9 UT/500ML-% IV SOLN
1.0000 m[IU]/min | INTRAVENOUS | Status: DC
  Administered 2020-01-07: 2 m[IU]/min via INTRAVENOUS
  Administered 2020-01-07 (×2): 1 m[IU]/min via INTRAVENOUS
  Filled 2020-01-07: qty 500

## 2020-01-07 MED ORDER — ONDANSETRON HCL 4 MG/2ML IJ SOLN
4.0000 mg | Freq: Four times a day (QID) | INTRAMUSCULAR | Status: DC | PRN
Start: 2020-01-07 — End: 2020-01-07

## 2020-01-07 MED ORDER — FENTANYL CITRATE (PF) 100 MCG/2ML IJ SOLN
50.0000 ug | INTRAMUSCULAR | Status: DC | PRN
Start: 2020-01-07 — End: 2020-01-07

## 2020-01-07 MED ORDER — OXYTOCIN BOLUS FROM INFUSION
333.0000 mL | Freq: Once | INTRAVENOUS | Status: AC
  Administered 2020-01-07: 333 mL via INTRAVENOUS

## 2020-01-07 MED ORDER — ACETAMINOPHEN 325 MG PO TABS: 650.0000 mg | ORAL_TABLET | ORAL | Status: DC | PRN

## 2020-01-07 MED ORDER — OXYTOCIN-SODIUM CHLORIDE 30-0.9 UT/500ML-% IV SOLN
2.5000 [IU]/h | INTRAVENOUS | Status: DC
Start: 2020-01-07 — End: 2020-01-07

## 2020-01-07 MED ORDER — ONDANSETRON HCL 4 MG/2ML IJ SOLN
4.0000 mg | Freq: Four times a day (QID) | INTRAMUSCULAR | Status: DC | PRN
  Administered 2020-01-07: 4 mg via INTRAVENOUS
  Filled 2020-01-07: qty 2

## 2020-01-07 MED ORDER — LACTATED RINGERS IV SOLN
500.0000 mL | INTRAVENOUS | Status: DC | PRN
  Administered 2020-01-07 (×2): 500 mL via INTRAVENOUS

## 2020-01-07 MED ORDER — TERBUTALINE SULFATE 1 MG/ML IJ SOLN
0.2500 mg | Freq: Once | INTRAMUSCULAR | Status: AC | PRN
  Administered 2020-01-07: 0.25 mg via SUBCUTANEOUS

## 2020-01-07 MED ORDER — PENICILLIN G POT IN DEXTROSE 60000 UNIT/ML IV SOLN
3.0000 10*6.[IU] | INTRAVENOUS | Status: DC
  Administered 2020-01-07 (×4): 3 10*6.[IU] via INTRAVENOUS
  Filled 2020-01-07 (×4): qty 50

## 2020-01-07 MED ORDER — DIPHENHYDRAMINE HCL 50 MG/ML IJ SOLN: 12.5000 mg | INTRAMUSCULAR | Status: DC | PRN

## 2020-01-07 MED ORDER — MISOPROSTOL 25 MCG QUARTER TABLET: 25.0000 ug | ORAL_TABLET | ORAL | Status: DC | PRN

## 2020-01-07 MED ORDER — LACTATED RINGERS AMNIOINFUSION
INTRAVENOUS | Status: DC
  Administered 2020-01-07: 50 mL via INTRAUTERINE

## 2020-01-07 MED ORDER — OXYTOCIN-SODIUM CHLORIDE 30-0.9 UT/500ML-% IV SOLN: 2.5000 [IU]/h | INTRAVENOUS | Status: DC

## 2020-01-07 MED ORDER — SOD CITRATE-CITRIC ACID 500-334 MG/5ML PO SOLN: 30.0000 mL | ORAL | Status: DC | PRN

## 2020-01-07 MED ORDER — SOD CITRATE-CITRIC ACID 500-334 MG/5ML PO SOLN
30.0000 mL | ORAL | Status: DC | PRN
Start: 2020-01-07 — End: 2020-01-07

## 2020-01-07 MED ORDER — OXYCODONE-ACETAMINOPHEN 5-325 MG PO TABS: 1.0000 | ORAL_TABLET | ORAL | Status: DC | PRN

## 2020-01-07 NOTE — Progress Notes (Signed)
Called to bedside for IUPC not reading. Cath adjusted by CNM and zero'd by RN. VE deferred.   Rhea Pink, MSN, CNM 01/07/2020 8:04 PM

## 2020-01-07 NOTE — Progress Notes (Signed)
Subjective:    Discussed placing internal monitors and pt agrees. Remains comfortable w/ epidural.   Objective:    VS: BP 122/69   Pulse 92   Temp 97.9 F (36.6 C) (Oral)   Resp 18   Ht 5\' 4"  (1.626 m)   Wt 97.5 kg   LMP 04/05/2019   SpO2 100%   BMI 36.90 kg/m  FHR : baseline 145 / variability minimal / accelerations absent / variable decelerations IUPC: contractions every 2 minutes / MVU 80 Membranes: AROM since 0300 Dilation: 8 Effacement (%): 90 Cervical Position: Posterior Station: 0 Presentation: Vertex Exam by:: [email or web address]  Pitocin off  Assessment/Plan:   39 y.o. 30 [redacted]w[redacted]d Admitted for SROM Hx HSV    -no prodromal S/S, no lesions    -Valtrex for suppression  Labor: Stopped Pitocin for tachysystole, FSE and IUPC placed Preeclampsia:  N/A Fetal Wellbeing:  Category II will start amnioinfusion Pain Control:  Epidural I/D:  GBS pos, adequate treatment w/ PCN Anticipated MOD:  NSVD  [redacted]w[redacted]d MSN, CNM 01/07/2020 7:03 PM

## 2020-01-07 NOTE — H&P (Signed)
Samantha Swanson is a 39 y.o. female, G6P0140, IUP at 39.4 weeks, presenting for latent labor with SROM. Pt endorse + Fm. Denies vaginal bleeding. Endorsdes feeling cxt's Q3-79mins.   Prenatal History consisted of:  History of premature delivery (2014, at 36 weeks, declines 17p) Idiopathic thrombocytopenic purpura (Nadir to 95 2019, 150 02/2019, "clumped" with apparent decrease 06/02/19. Recheck with NOB labs. Prior evaluation at [facility]. 113 at NOB, recheck in 1 month--refer if thrombocytopenia (109); 10/14/19: Platelets 119. 175 on 7/13.) Late entry into prenatal care (14 weeks( Advanced maternal age gravida (Age 19, low risk female panorama) allergy to latex Anxiety/depression (No Meds)  Migraines genital herpes simplex (no lesion, no prodromal s/sx, Valtrex from 36 weeks) Group B Streptococcus carrier (Tx with Pen in labor)  heart murmur   Patient Active Problem List   Diagnosis Date Noted  . Post-dates pregnancy 01/07/2020  . Normal labor and delivery 01/07/2020  . Syncope 12/25/2012  . Hypoxia 12/25/2012  . Preventative health care 11/28/2010  . Anxiety 11/28/2010  . Sciatica of right side 11/28/2010  . Allergy   . Depression   . Heart murmur   . Chronic headaches   . ITP (idiopathic thrombocytopenic purpura)   . Migraines   . Genital herpes      Medications Prior to Admission  Medication Sig Dispense Refill Last Dose  . ACYCLOVIR PO Take by mouth.   01/06/2020 at Unknown time  . Prenatal Vit-Fe Fumarate-FA (PRENATAL VITAMINS PO) Take by mouth.   01/06/2020 at Unknown time    Past Medical History:  Diagnosis Date  . Abnormal Pap smear   . Allergy   . Chronic headaches   . Depression    not on meds, ok now  . Genital herpes   . Heart murmur    no work up  . HSV infection   . ITP (idiopathic thrombocytopenic purpura)   . Migraines   . Ovarian cyst   . Sciatica of right side 11/28/2010  . Thrombocytopenia (HCC)   . UTI (lower urinary tract infection)   .  Vaginal Pap smear, abnormal    no repeat     No current facility-administered medications on file prior to encounter.   Current Outpatient Medications on File Prior to Encounter  Medication Sig Dispense Refill  . ACYCLOVIR PO Take by mouth.    . Prenatal Vit-Fe Fumarate-FA (PRENATAL VITAMINS PO) Take by mouth.       Allergies  Allergen Reactions  . Latex Itching    burning    History of present pregnancy: Pt Info/Preference:  Screening/Consents:  Labs:   EDD: Estimated Date of Delivery: 01/14/20  Establised: Patient's last menstrual period was 04/01/2019.  Anatomy Scan: Date: 08/19/2019 Placenta Location: anterior Genetic Screen: Panoroma:low risk female AFP:  First Tri: Quad:  Office: [facility]            First PNV: 13.6 wg Blood Type  O+  Language: english Last PNV: 39.3 wg Rhogam  N/A  Flu Vaccine:  declined   Antibody  Neg  TDaP vaccine declined   GTT: Early: 5.2 hga1c Third Trimester: 94   Feeding Plan: ??? BTL: no Rubella:  Immune  Contraception: ??? VBAC: no RPR:   NR  Circumcision: N/A   HBsAg:  Neg  Pediatrician:  ???   HIV:   Neg  Prenatal Classes: no Additional [place]: Yes see growth below GBS:  Positive(For PCN allergy, check sensitivities)       Chlamydia: neg    MFM Referral/Consult:  GC: neg  Support Person: partner   PAP: Unknown  Pain Management: epidural Neonatologist Referral:  Hgb Electrophoresis:  AA  Birth Plan: none   Hgb NOB: 12.3    28W: 11.2  Growth scan 12/15/2019:  OB History    Gravida  6   Para  1   Term  0   Preterm  1   AB  4   Living  1     SAB  2   TAB  2   Ectopic  0   Multiple  0   Live Births  1          Past Medical History:  Diagnosis Date  . Abnormal Pap smear   . Allergy   . Chronic headaches   . Depression    not on meds, ok now  . Genital herpes   . Heart murmur    no work up  . HSV infection   . ITP (idiopathic thrombocytopenic purpura)   . Migraines   . Ovarian cyst   . Sciatica of right side  11/28/2010  . Thrombocytopenia (HCC)   . UTI (lower urinary tract infection)   . Vaginal Pap smear, abnormal    no repeat   Past Surgical History:  Procedure Laterality Date  . INDUCED ABORTION     x2   Family History: family history includes Cancer in an other family member; Diabetes in her mother; Heart disease in an other family member; Hypertension in her father, mother, and another family member; Mental illness in some other family members; Stroke in an other family member. Social History:  reports that she has quit smoking. Her smoking use included cigarettes. She has a 5.00 pack-year smoking history. She has never used smokeless tobacco. She reports previous alcohol use. She reports previous drug use. Frequency: 5.00 times per week. Drug: Marijuana.   Prenatal Transfer Tool  Maternal Diabetes: No Genetic Screening: Normal Maternal Ultrasounds/Referrals: Normal Fetal Ultrasounds or other Referrals:  None Maternal Substance Abuse:  No Significant Maternal Medications:  Meds include: Other:  valacyclovir Significant Maternal Lab Results: Group B Strep positive and Other:  HSV +, no lesions   ROS:  Review of Systems  Constitutional: Negative.   HENT: Negative.   Eyes: Negative.   Respiratory: Negative.   Cardiovascular: Negative.   Gastrointestinal: Positive for abdominal pain.  Genitourinary:       Leakage of fluid  Musculoskeletal: Negative.   Skin: Negative.   Neurological: Negative.   Endo/Heme/Allergies: Negative.   Psychiatric/Behavioral: Negative.      Physical Exam: BP 117/64   Pulse 79   Temp 98 F (36.7 C) (Oral)   Resp 18   Ht 5\' 4"  (1.626 m)   Wt 97.5 kg   LMP 04/01/2019   BMI 36.90 kg/m   Physical Exam Constitutional:      Appearance: Normal appearance.  HENT:     Head: Normocephalic and atraumatic.     Nose: Nose normal.     Mouth/Throat:     Mouth: Mucous membranes are moist.  Eyes:     Extraocular Movements: Extraocular movements intact.       Conjunctiva/sclera: Conjunctivae normal.     Pupils: Pupils are equal, round, and reactive to light.  Cardiovascular:     Rate and Rhythm: Normal rate and regular rhythm.     Pulses: Normal pulses.     Heart sounds: Normal heart sounds.  Pulmonary:     Effort: Pulmonary effort is normal.  Breath sounds: Normal breath sounds.  Abdominal:     General: Abdomen is flat.     Palpations: Abdomen is soft.  Genitourinary:    General: Normal vulva.     Rectum: Normal.     Comments: No lesion on noted on exam. Pelvis adequate for vaginal delivery.  Musculoskeletal:        General: Normal range of motion.     Cervical back: Normal range of motion and neck supple.  Skin:    General: Skin is warm and dry.     Capillary Refill: Capillary refill takes less than 2 seconds.  Neurological:     General: No focal deficit present.     Mental Status: She is alert.  Psychiatric:        Mood and Affect: Mood normal.        Behavior: Behavior normal.        Thought Content: Thought content normal.        Judgment: Judgment normal.      NST: FHR baseline 140 bpm, Variability: moderate, Accelerations:present, Decelerations:  Absent= Cat 1/Reactive UC:   irregular, every 2-4 minutes, lasting 30-60 seconds, mild to palpate SVE:   Dilation: 3 Effacement (%): 80 Station: -1 Exam by:: LAUREN, RN, vertex verified by fetal sutures.  Leopold's: Position vertex, EFW 6.5lbs via leopold's.   Labs: No results found for this or any previous visit (from the past 24 hour(s)).  Imaging:  No results found.  MAU Course: Orders Placed This Encounter  Procedures  . CBC  . RPR  . Diet clear liquid Room service appropriate? Yes; Fluid consistency: Thin  . Discontinue Pitocin if tachysystole with non-reassuring FHR is present  . Evaluate fetal heart rate to establish reassuring pattern prior to initiating Cytotec or Pitocin  . If tachysystole WITH reassuring FHR present notify MD / CNM  . Initiate  intrauterine resuscitation if tachysystole with non-reasuring FHR is present  . Labor Induction  . May administer Terbutaline 0.25 mg SQ x 1 dose if tachysystole with non-reassuring FHR is presesnt  . Nofify MD/CNM if tachysystole with non-reassuring FHR is present  . Perform a cervical exam prior to initiating Cytotec or Pitocin  . Activity as tolerated  . Fetal monitoring per unit policy  . Notify Physician  . Vitals signs per unit policy  . Order Rapid HIV per protocol if no results on chart  . Discontinue foley prior to vaginal delivery  . Fundal check post delivery every 15 min x 1 hour then every 30 min x 1 hour  . If Rapid HIV test positive or known HIV positive: initiate AZT orders  . Initiate Carrier Fluid Protocol  . Initiate Oral Care Protocol  . Insert foley catheter  . May in and out cath x 2 for inability to void  . Measure blood pressure post delivery every 15 min x 1 hour then every 30 min x 1 hour  . Patient may have epidural placement upon request  . Cervical Exam  . Vitals signs per unit policy  . Notify Physician  . Fetal monitoring per unit policy  . Activity as tolerated  . Cervical Exam  . Measure blood pressure post delivery every 15 min x 1 hour then every 30 min x 1 hour  . Fundal check post delivery every 15 min x 1 hour then every 30 min x 1 hour  . If Rapid HIV test positive or known HIV positive: initiate AZT orders  . May in and out  cath x 2 for inability to void  . Insert foley catheter  . Discontinue foley prior to vaginal delivery  . Initiate Carrier Fluid Protocol  . Initiate Oral Care Protocol  . Order Rapid HIV per protocol if no results on chart  . Informed Consent Details: Physician/Practitioner Attestation; Transcribe to consent form and obtain patient signature  . Patient may have epidural placement upon request  . May use local infiltration of 1% lidocaine plain to produce a skin wheal prior to IV insertion  . Notify in-house Anesthesia  team of nausea and vomiting greater than 5 hours  . Assess for signs/symptoms of PIH/preeclampsia  . RN to place order for: CBC if one has not been drawn in the past 6 hours for all patients with hypertensive disease, pre-eclampsia, eclampsia, thrombocytopenia or previous PLTC<150,000.  . Identify to Anesthesia if patient plans to have postpartum tubal ligation; do not remove epidural without discussion with Anesthesiologist  . Vital signs following Epidural Placement, re-bolus or re-dose monitor patient's BP and oxygen saturation every 5 minutes for 30 minutes  . RN to remain at bedside continuously for 30 minutes post epidural placement, post re-bolus / re-dose  . Notify Anesthesia if the patient becomes short of breath or complains of heaviness in chest, chest pain, and/or unrelieved pain  . Notify Anesthesia prior to discontinuing epidural infusion  . Full code  . Type and screen  . Insert and maintain IV Line  . Insert and maintain IV Line  . Admit to Inpatient (patient's expected length of stay will be greater than 2 midnights or inpatient only procedure)  . Admit to Inpatient (patient's expected length of stay will be greater than 2 midnights or inpatient only procedure)   Meds ordered this encounter  Medications  . misoprostol (CYTOTEC) tablet 25 mcg  . terbutaline (BRETHINE) injection 0.25 mg  . lactated ringers infusion  . lactated ringers infusion 500-1,000 mL  . oxytocin (PITOCIN) IV BOLUS FROM BAG  . oxytocin (PITOCIN) IV infusion 30 units in NS 500 mL - Premix  . acetaminophen (TYLENOL) tablet 650 mg  . fentaNYL (SUBLIMAZE) injection 50-100 mcg  . lidocaine (PF) (XYLOCAINE) 1 % injection 30 mL  . ondansetron (ZOFRAN) injection 4 mg  . FOLLOWED BY Linked Order Group   . penicillin G potassium 5 Million Units in sodium chloride 0.9 % 250 mL IVPB     Order Specific Question:   Antibiotic Indication:     Answer:   Group B Strep Prophylaxis   . penicillin G potassium 3 Million  Units in dextrose 33mL IVPB     Order Specific Question:   Antibiotic Indication:     Answer:   Group B Strep Prophylaxis  . sodium citrate-citric acid (ORACIT) solution 30 mL  . lactated ringers infusion  . oxytocin (PITOCIN) IV BOLUS FROM BAG  . oxytocin (PITOCIN) IV infusion 30 units in NS 500 mL - Premix  . lactated ringers infusion 500-1,000 mL  . acetaminophen (TYLENOL) tablet 650 mg  . oxyCODONE-acetaminophen (PERCOCET/ROXICET) 5-325 MG per tablet 1 tablet  . oxyCODONE-acetaminophen (PERCOCET/ROXICET) 5-325 MG per tablet 2 tablet  . ondansetron (ZOFRAN) injection 4 mg  . sodium citrate-citric acid (ORACIT) solution 30 mL  . lidocaine (PF) (XYLOCAINE) 1 % injection 30 mL  . fentaNYL (SUBLIMAZE) injection 50-100 mcg  . FOLLOWED BY Linked Order Group   . penicillin G potassium 5 Million Units in sodium chloride 0.9 % 250 mL IVPB     Order Specific Question:  Antibiotic Indication:     Answer:   Group B Strep Prophylaxis   . penicillin G potassium 3 Million Units in dextrose 50mL IVPB     Order Specific Question:   Antibiotic Indication:     Answer:   Group B Strep Prophylaxis  . ePHEDrine injection 10 mg  . PHENYLephrine 40 mcg/ml in normal saline Adult IV Push Syringe (For Blood Pressure Support)  . lactated ringers infusion 500 mL  . fentaNYL 2 mcg/mL w/ bupivacaine 0.125% in NS 250 mL epidural infusion ([facility])  . diphenhydrAMINE (BENADRYL) injection 12.5 mg  . ePHEDrine injection 10 mg  . PHENYLephrine 40 mcg/ml in normal saline Adult IV Push Syringe (For Blood Pressure Support)    Assessment/Plan: Samantha Swanson is a 39 y.o. female, G6P0140, IUP at 39.4 weeks, presenting for latent labor with SROM, #0300 clear fluids. Pt endorse + Fm. Denies vaginal bleeding. Endorsdes feeling cxt's Q3-39mins.   Prenatal History consisted of:  History of premature delivery (2014, at 36 weeks, declines 17p) Idiopathic thrombocytopenic purpura (Nadir to 95 2019, 150 02/2019, "clumped"  with apparent decrease 06/02/19. Recheck with NOB labs. Prior evaluation at [facility]. 113 at NOB, recheck in 1 month--refer if thrombocytopenia (109); 10/14/19: Platelets 119. 175 on 7/13.) Late entry into prenatal care (14 weeks( Advanced maternal age gravida (Age 16, low risk female panorama) allergy to latex Anxiety/depression (No Meds)  Migraines genital herpes simplex (no lesion, no prodromal s/sx, Valtrex from 36 weeks) Group B Streptococcus carrier (Tx with Pen in labor)  heart murmur   FWB: Cat 1 Fetal Tracing.   Plan: Admit to Birthing Suite per consult with Dr Normand Sloop Routine [facility] orders Pain med/epidural prn PCN G for GBS prophylaxis H/O Anxiety/Depression: Monitor mood offer SSRI PP Latex allergy: Avoid latex H/O HSV: Continue valtrex.  Thrombocytopenia: Pending platelets.  Anticipate starting pitocin if prodromal labor Anticipate labor progression   Dale Iola NP-C, CNM, MSN 01/07/2020, 5:07 AM

## 2020-01-07 NOTE — MAU Note (Signed)
PT SAYS HAS HX OF HSV- NO OUTBREAK DURING PREG - TAKES MEDS .

## 2020-01-07 NOTE — Progress Notes (Signed)
Subjective:    Coping well w/ labor. Desires unmedicated labor and birth. Aware of pain management options.   Objective:    VS: BP 124/74   Pulse 79   Temp 98.3 F (36.8 C) (Oral)   Resp 18   Ht 5\' 4"  (1.626 m)   Wt 97.5 kg   LMP 04/05/2019   BMI 36.90 kg/m  FHR : baseline 145 / variability moderate / accelerations present / absent decelerations Toco: contractions every 2-3 minutes Membranes: SROM 01/07/20 @ 0300. AROM of forebag, clear fluid  Dilation: 6 Effacement (%): 90 Cervical Position: Posterior Station: 0 Presentation: Vertex Exam by:: [email or web address], CNM   Assessment/Plan:   39 y.o. [phone] [redacted]w[redacted]d  Labor: Progressing normally, AROM of forebag,  Preeclampsia:  N/A Fetal Wellbeing:  Category I Pain Control:  Labor support without medications I/D:  GBS pos, adequate treatment w/ PCN Anticipated MOD:  NSVD  [redacted]w[redacted]d MSN, CNM 01/07/2020 1:10 PM

## 2020-01-07 NOTE — MAU Note (Signed)
Pt reports to MAU c/o ctx every 4 min. No bleeding. Possible SROM @ 0300. Pt reports +FM.

## 2020-01-07 NOTE — Anesthesia Procedure Notes (Signed)
Epidural Patient location during procedure: OB Start time: 01/07/2020 3:00 PM End time: 01/07/2020 3:13 PM  Staffing Anesthesiologist: Mal Amabile, MD Performed: anesthesiologist   Preanesthetic Checklist Completed: patient identified, IV checked, site marked, risks and benefits discussed, surgical consent, monitors and equipment checked, pre-op evaluation and timeout performed  Epidural Patient position: sitting Prep: DuraPrep and site prepped and draped Patient monitoring: continuous pulse ox and blood pressure Approach: midline Location: L4-L5 Injection technique: LOR saline  Needle:  Needle type: Tuohy  Needle gauge: 17 G Needle length: 9 cm and 9 Needle insertion depth: 8 cm Catheter type: closed end flexible Catheter size: 19 Gauge Catheter at skin depth: 13 cm Test dose: negative and Other  Assessment Events: blood not aspirated, injection not painful, no injection resistance, no paresthesia and negative IV test  Additional Notes Patient identified. Risks and benefits discussed including failed block, incomplete  Pain control, post dural puncture headache, nerve damage, paralysis, blood pressure Changes, nausea, vomiting, reactions to medications-both toxic and allergic and post Partum back pain. All questions were answered. Patient expressed understanding and wished to proceed. Sterile technique was used throughout procedure. Epidural site was Dressed with sterile barrier dressing. No paresthesias, signs of intravascular injection Or signs of intrathecal spread were encountered.  Difficult due to poor positioning. Attempt x 3 Patient was more comfortable after the epidural was dosed. Please see RN's note for documentation of vital signs and FHR which are stable. Reason for block:procedure for pain

## 2020-01-07 NOTE — Progress Notes (Addendum)
Subjective:    Comfortable w/ epidural. Discussed use of Pitocin to augment l abor/ Pt agrees.   Objective:    VS: BP (!) 109/56    Pulse 80    Temp 97.9 F (36.6 C) (Oral)    Resp 18    Ht 5\' 4"  (1.626 m)    Wt 97.5 kg    LMP 04/05/2019    SpO2 100%    BMI 36.90 kg/m  FHR : baseline 145 / variability moderate / accelerations present / variable decelerations Toco: TOCO adjusted Membranes: SROM since 0300, clear fluid continues Dilation: 7 Effacement (%): 90 Cervical Position: Posterior Station: 0 Presentation: Vertex Exam by:: [email or web address], RN   Assessment/Plan:   39 y.o. [phone] [redacted]w[redacted]d Admitted for SROM Hx HSV    -no prodromal S/S, no lesions    -Valtrex for suppression  Labor: Protracted active phase, will start Pitocin 1x1 Preeclampsia:  N/A Fetal Wellbeing:  Category I Pain Control:  Epidural I/D:  GBS positive, adequate treatment w/ PCN Anticipated MOD:  NSVD  [redacted]w[redacted]d MSN, CNM 01/07/2020

## 2020-01-07 NOTE — Progress Notes (Signed)
Subjective:    C/O nausea and shakes.   Objective:    VS: BP 127/88   Pulse 86   Temp 98 F (36.7 C) (Oral)   Resp 18   Ht 5\' 4"  (1.626 m)   Wt 97.5 kg   LMP 04/05/2019   SpO2 100%   BMI 36.90 kg/m  FHR : baseline 150 / variability moderate / accelerations present / variable decelerations IUPC: contractions every 2 minutes / MVU 90 Membranes: SROM since 0300, remains clear, return of clear fluid w/ amnioinfusion Dilation: 9 Effacement (%): 80 Cervical Position: Posterior Station: 0 Presentation: Vertex Exam by:: [email or web address] CNM   Assessment/Plan:   39 y.o. [phone] [redacted]w[redacted]d Admitted for SROM Hx HSV    -no prodromal S/S, no lesions    -Valtrex for suppression  Labor: restarting pitocin Preeclampsia:  N/A Fetal Wellbeing:  Category II Pain Control:  Epidural I/D:  GBS pos, adequate treatment w/ PCN Anticipated MOD:  NSVD  [redacted]w[redacted]d MSN, CNM 01/07/2020 9:55 PM

## 2020-01-07 NOTE — Anesthesia Preprocedure Evaluation (Signed)
Anesthesia Evaluation  Patient identified by MRN, date of birth, ID band Patient awake    Reviewed: Allergy & Precautions, Patient's Chart, lab work & pertinent test results  History of Anesthesia Complications (+) Family history of anesthesia reaction  Airway Mallampati: II  TM Distance: >3 FB Neck ROM: Full    Dental no notable dental hx. (+) Teeth Intact   Pulmonary former smoker,    Pulmonary exam normal breath sounds clear to auscultation       Cardiovascular negative cardio ROS Normal cardiovascular exam+ Valvular Problems/Murmurs  Rhythm:Regular Rate:Normal     Neuro/Psych  Headaches, PSYCHIATRIC DISORDERS Anxiety Depression  Neuromuscular disease    GI/Hepatic Neg liver ROS, GERD  ,  Endo/Other  Obesity  Renal/GU negative Renal ROS  negative genitourinary   Musculoskeletal negative musculoskeletal ROS (+) Right Sciatica   Abdominal (+) + obese,   Peds  Hematology  (+) anemia , Hx/o ITP   Anesthesia Other Findings   Reproductive/Obstetrics (+) Pregnancy HSV                             Anesthesia Physical Anesthesia Plan  ASA: II  Anesthesia Plan: Epidural   Post-op Pain Management:    Induction:   PONV Risk Score and Plan:   Airway Management Planned: Natural Airway  Additional Equipment:   Intra-op Plan:   Post-operative Plan:   Informed Consent: I have reviewed the patients History and Physical, chart, labs and discussed the procedure including the risks, benefits and alternatives for the proposed anesthesia with the patient or authorized representative who has indicated his/her understanding and acceptance.       Plan Discussed with: Anesthesiologist  Anesthesia Plan Comments:         Anesthesia Quick Evaluation

## 2020-01-08 ENCOUNTER — Encounter (HOSPITAL_COMMUNITY): Payer: Self-pay | Admitting: Obstetrics and Gynecology

## 2020-01-08 LAB — CBC
HCT: 31.8 % — ABNORMAL LOW (ref 36.0–46.0)
Hemoglobin: 10.3 g/dL — ABNORMAL LOW (ref 12.0–15.0)
MCH: 28.1 pg (ref 26.0–34.0)
MCHC: 32.4 g/dL (ref 30.0–36.0)
MCV: 86.6 fL (ref 80.0–100.0)
Platelets: 171 10*3/uL (ref 150–400)
RBC: 3.67 MIL/uL — ABNORMAL LOW (ref 3.87–5.11)
RDW: 14.6 % (ref 11.5–15.5)
WBC: 13.3 10*3/uL — ABNORMAL HIGH (ref 4.0–10.5)
nRBC: 0 % (ref 0.0–0.2)

## 2020-01-08 LAB — RPR: RPR Ser Ql: NONREACTIVE — AB

## 2020-01-08 LAB — OB RESULTS CONSOLE HEPATITIS B SURFACE ANTIGEN
Hepatitis B Surface Ag: NEGATIVE
Hepatitis B Surface Ag: NEGATIVE

## 2020-01-08 MED ORDER — TETANUS-DIPHTH-ACELL PERTUSSIS 5-2.5-18.5 LF-MCG/0.5 IM SUSP: 0.5000 mL | Freq: Once | INTRAMUSCULAR | Status: DC

## 2020-01-08 MED ORDER — ONDANSETRON HCL 4 MG PO TABS: 4.0000 mg | ORAL_TABLET | ORAL | Status: DC | PRN

## 2020-01-08 MED ORDER — BENZOCAINE-MENTHOL 20-0.5 % EX AERO
1.0000 "application " | INHALATION_SPRAY | CUTANEOUS | Status: DC | PRN
  Administered 2020-01-08: 1 via TOPICAL
  Filled 2020-01-08: qty 56

## 2020-01-08 MED ORDER — COCONUT OIL OIL: 1.0000 "application " | TOPICAL_OIL | Status: DC | PRN

## 2020-01-08 MED ORDER — PRENATAL MULTIVITAMIN CH
1.0000 | ORAL_TABLET | Freq: Every day | ORAL | Status: DC
  Administered 2020-01-08 – 2020-01-10 (×3): 1 via ORAL
  Filled 2020-01-08 (×3): qty 1

## 2020-01-08 MED ORDER — ACETAMINOPHEN 325 MG PO TABS: 650.0000 mg | ORAL_TABLET | ORAL | Status: DC | PRN

## 2020-01-08 MED ORDER — SIMETHICONE 80 MG PO CHEW: 80.0000 mg | CHEWABLE_TABLET | ORAL | Status: DC | PRN

## 2020-01-08 MED ORDER — DIBUCAINE (PERIANAL) 1 % EX OINT
1.0000 "application " | TOPICAL_OINTMENT | Freq: Four times a day (QID) | CUTANEOUS | Status: DC
  Administered 2020-01-08: 1 via RECTAL
  Filled 2020-01-08: qty 28

## 2020-01-08 MED ORDER — DIBUCAINE (PERIANAL) 1 % EX OINT
1.0000 "application " | TOPICAL_OINTMENT | Freq: Four times a day (QID) | CUTANEOUS | Status: DC
  Administered 2020-01-08 (×2): 1 via RECTAL
  Filled 2020-01-08: qty 28

## 2020-01-08 MED ORDER — ONDANSETRON HCL 4 MG/2ML IJ SOLN: 4.0000 mg | INTRAMUSCULAR | Status: DC | PRN

## 2020-01-08 MED ORDER — WITCH HAZEL-GLYCERIN EX PADS
1.0000 "application " | MEDICATED_PAD | Freq: Four times a day (QID) | CUTANEOUS | Status: DC
  Administered 2020-01-08: 1 via TOPICAL

## 2020-01-08 MED ORDER — SENNOSIDES-DOCUSATE SODIUM 8.6-50 MG PO TABS
2.0000 | ORAL_TABLET | ORAL | Status: DC
  Administered 2020-01-08 – 2020-01-09 (×2): 2 via ORAL
  Filled 2020-01-08 (×4): qty 2

## 2020-01-08 MED ORDER — DIPHENHYDRAMINE HCL 25 MG PO CAPS: 25.0000 mg | ORAL_CAPSULE | Freq: Four times a day (QID) | ORAL | Status: DC | PRN

## 2020-01-08 MED ORDER — IBUPROFEN 600 MG PO TABS
600.0000 mg | ORAL_TABLET | Freq: Four times a day (QID) | ORAL | Status: DC
  Administered 2020-01-08 – 2020-01-10 (×10): 600 mg via ORAL
  Filled 2020-01-08 (×10): qty 1

## 2020-01-08 MED ORDER — WITCH HAZEL-GLYCERIN EX PADS
1.0000 "application " | MEDICATED_PAD | Freq: Four times a day (QID) | CUTANEOUS | Status: DC
  Administered 2020-01-08 (×2): 1 via TOPICAL

## 2020-01-08 NOTE — Anesthesia Postprocedure Evaluation (Signed)
Anesthesia Post Note  Patient: Samantha Swanson  Procedure(s) Performed: AN AD HOC LABOR EPIDURAL     Patient location during evaluation: Mother Baby Anesthesia Type: Epidural Level of consciousness: awake Pain management: satisfactory to patient Vital Signs Assessment: post-procedure vital signs reviewed and stable Respiratory status: spontaneous breathing Cardiovascular status: stable Anesthetic complications: no   No complications documented.  Last Vitals:  Vitals:   01/08/20 0625 01/08/20 1031  BP: 117/68 108/62  Pulse: 82 92  Resp: 18 20  Temp: (!) 36.4 C 36.6 C  SpO2: 98% 97%    Last Pain:  Vitals:   01/08/20 1115  TempSrc:   PainSc: 0-No pain   Pain Goal:                   [employer]

## 2020-01-08 NOTE — Progress Notes (Signed)
MOB was referred for history of depression/anxiety. * Referral screened out by Clinical Social Worker because none of the following criteria appear to apply: ~ History of anxiety/depression during this pregnancy, or of post-partum depression following prior delivery. ~ Diagnosis of anxiety and/or depression within last 3 years. Per further chart review it appears that MOB  Was diagnosed with anxiety/depression in 2012.  OR * MOB's symptoms currently being treated with medication and/or therapy.   Please contact the Clinical Social Worker if needs arise, by [facility] request, or if MOB scores greater than 9/yes to question 10 on Edinburgh Postpartum Depression Screen.   Claude Manges [place] Alfredo, MSW, LCSW [facility] [place] [phone]

## 2020-01-08 NOTE — Progress Notes (Signed)
PPD# 1 SVD w/ intact perineum Information for the patient's newborn:  Emely, Fahy Girl [[id number]]  female    Baby Name Nylah Circumcision N/A   S:   Reports feeling good Tolerating PO fluid and solids No nausea or vomiting Bleeding is light, no clots Pain controlled with PO and topical meds Up ad lib / ambulatory / voiding w/o difficulty Feeding: Breast    O:   VS: BP 117/68 (BP Location: Left Arm)   Pulse 82   Temp (!) 97.5 F (36.4 C) (Oral)   Resp 18   Ht 5\' 4"  (1.626 m)   Wt 97.5 kg   LMP 04/05/2019   SpO2 98%   Breastfeeding Unknown   BMI 36.90 kg/m   LABS:  Recent Labs    01/07/20 1431 01/08/20 0501  WBC 9.0 13.3*  HGB 11.8* 10.3*  PLT 194 171   Blood type: --/--/O POS (08/18 0439) Rubella: Immune (02/23 0000)                      I&O: Intake/Output      08/18 0701 - 08/19 0700 08/19 0701 - 08/20 0700   I.V. (mL/kg) 0 (0)    Other 0    IV Piggyback 0    Total Intake(mL/kg) 0 (0)    Urine (mL/kg/hr) 1150 (0.5)    Blood 250    Total Output 1400    Net -1400           Physical Exam: Alert and oriented X3 Lungs: unlabored Heart: RRR Abdomen: soft, non-tender, non-distended  Fundus: firm, non-tender Perineum: intact, non-thrombosed hemorrhoids Lochia: appropriate Extremities: 1+ edema, no calf pain or tenderness    A:  PPD # 1  Normal exam  P:  Routine post partum orders Hemorrhoidal care Anticipate D/C on 01/09/20  Plan reviewed w/ Dr. 01/11/20, MSN, CNM 01/08/2020, 8:12 AM

## 2020-01-08 NOTE — Lactation Note (Signed)
This note was copied from a Samantha's chart. Lactation Consultation Note  Patient Name: Samantha Swanson Today's Date: 01/08/2020  Samantha girl [name] now 90 hours old.  Mom reports that she is worried [name] is not getting enough.  Mom breastfed her first Samantha for 2 months that is now 37 years old.  Mom reports no challenges but it was just hard at twenty. Reviewed Understanding Mother and Samantha.  Reviewed and gave [facility] handout.   Mom reports she tried to feed her about 20 minutes ago but she was pretty sleepy and fell asleep.  Offered to assist.  Showed mom how to hand express and feed her some drops of colostrum from a spoon. Infant started rousing.  Minimal assist with latching in cross cradle hold on right breast.  Mom reports comfort. Infant started getting sleepy. Left mom and Samantha breastfeeding.  Urged to call lactation as needed.    Maternal Data    Feeding Feeding Type: Breast Fed  [facility] Score                   Interventions    Lactation Tools Discussed/Used     Consult Status      Neomia Dear 01/08/2020, 6:38 PM

## 2020-01-08 NOTE — Lactation Note (Signed)
This note was copied from a Samantha's chart. Lactation Consultation Note  Patient Name: Samantha Swanson Today's Date: 01/08/2020   LC attempted to see.  Everyone asleep. Will follow up later.  Maternal Data    Feeding Feeding Type: Breast Fed  LATCH Score                   Interventions    Lactation Tools Discussed/Used Tools: Pump Breast pump type: Manual   Consult Status      Marley Charlot S Kaedynce Tapp 01/08/2020, 3:30 PM

## 2020-01-09 ENCOUNTER — Encounter (HOSPITAL_COMMUNITY): Payer: Self-pay | Admitting: Obstetrics and Gynecology

## 2020-01-09 LAB — SURGICAL PATHOLOGY

## 2020-01-09 NOTE — Lactation Note (Signed)
This note was copied from a Samantha's chart. Lactation Consultation Note  Patient Name: Samantha Swanson Date: 01/09/2020 Reason for consult: Follow-up assessment   Staff nurse reports that infant is not feeding well. She reports that infant had a terminal stool at del and has not has another stool since. Infant is 25 hours old.  Follow up for latch assistance. Infant placed in cross cradle hold. Infant latched but was observed with non-nutritive suckling.  Infant switched to alternate breast in football hold. Infant sustained latch for 10 mins with observed swallows.   Suggested that mother supplement infant due to poor feeding. Infant still seems hungry after breastfeeding. Mother to hand express and spoon feed infant until she gets supplement. Mother was informed of available formula or donor milk.  Staff nurse will follow up with mother to see which supplement she desires.   Mother has a hand pump at the bedside . Advised that she hand pump for 15 mins on each breast.    Mother to continue to cue base feed infant and feed at least 8-12 times or more in 24 hours and advised to allow for cluster feeding infant as needed.   Mother to continue to due STS. Mother is aware of available LC services at [facility], BFSG'S, OP Dept, and phone # for questions or concerns about breastfeeding.  Mother receptive to all teaching and plan of care.     Maternal Data    Feeding Feeding Type: Donor Breast Milk Nipple Type: Slow - flow  LATCH Score Latch: Grasps breast easily, tongue down, lips flanged, rhythmical sucking.  Audible Swallowing: A few with stimulation  Type of Nipple: Everted at rest and after stimulation  Comfort (Breast/Nipple): Soft / non-tender  Hold (Positioning): Assistance needed to correctly position infant at breast and maintain latch.  LATCH Score: 8  Interventions Interventions: Assisted with latch;Skin to skin;Hand express;Breast compression;Adjust position;Support  pillows;Position options;Hand pump  Lactation Tools Discussed/Used     Consult Status Consult Status: Follow-up    Stevan Born [facility] 01/09/2020, 11:15 AM

## 2020-01-09 NOTE — Progress Notes (Signed)
PPD # 1 S/P NSVD  Live born female  Birth Weight: 7 lb 4.9 oz (3314 g) APGAR: 4, 8  Newborn Delivery   Birth date/time: 01/[birth year]/2020 22:57:00 Delivery type: Vaginal, Spontaneous     Baby name: [name] Delivering provider: Rhea Pink B  Episiotomy:None   Lacerations:None   Feeding: breast  Pain control at delivery: Epidural   S:  Reports feeling sore but well             Tolerating po/ No nausea or vomiting             Bleeding is light             Pain controlled with acetaminophen and ibuprofen (OTC)             Up ad lib / ambulatory / voiding without difficulties   O:  A & O x 3, in no apparent distress              VS:  Vitals:   01/08/20 1031 01/08/20 1347 01/08/20 2300 01/09/20 0615  BP: 108/62 108/65 120/61 124/67  Pulse: 92 86 83 71  Resp: 20 18 16 18   Temp: 97.8 F (36.6 C) 97.8 F (36.6 C) 97.7 F (36.5 C) 98.1 F (36.7 C)  TempSrc: Oral Oral Oral Oral  SpO2: 97% 100% 99% 100%  Weight:      Height:        LABS:  Recent Labs    01/07/20 1431 01/08/20 0501  WBC 9.0 13.3*  HGB 11.8* 10.3*  HCT 35.7* 31.8*  PLT 194 171    Blood type: --/--/O POS (08/18 0439)  Rubella: Immune (02/23 0000)   I&O: I/O last 3 completed shifts: In: 0  Out: 1400 [Urine:1150; Blood:250]          No intake/output data recorded.  Vaccines: TDaP declined         Flu    declined   Gen: AAO x 3, NAD  Abdomen: soft, non-tender, non-distended             Fundus: firm, non-tender, U-1  Perineum: intact  Lochia: small  Extremities: no edema, no calf pain or tenderness    A/P: PPD # 1 39 y.o., 10-30-1983   Principal Problem:   Postpartum care following vaginal delivery 8/18 Active Problems:   Normal labor and delivery   SVD (8/18)   Doing well - stable status  Routine post partum orders  Anticipate discharge tomorrow  Contraception - unhappy w/ hormonal options, discussed diaphragm and Phexxi, will f/u at [facility] visit    [facility], MSN, CNM 01/09/2020, 10:27  AM

## 2020-01-10 MED ORDER — IBUPROFEN 600 MG PO TABS
600.0000 mg | ORAL_TABLET | Freq: Four times a day (QID) | ORAL | 0 refills | Status: DC
Start: 2020-01-10 — End: 2021-04-18

## 2020-01-10 NOTE — Discharge Summary (Signed)
   Postpartum Discharge Summary       Patient Name: Samantha Swanson DOB: 09/14/1980 MRN: [id number]  Date of admission: 01/07/2020 Delivery date:01/07/2020  Delivering provider: GRICE, VIVIAN B  Date of discharge: 01/10/2020  Admitting diagnosis: Post-dates pregnancy [O48.0] Normal labor and delivery [O80] Intrauterine pregnancy: [redacted]w[redacted]d     Secondary diagnosis:  Principal Problem:   Postpartum care following vaginal delivery 8/18 Active Problems:   Normal labor and delivery   HSV (herpes simplex virus) infection   SVD (8/18)  Additional problems: NA    Discharge diagnosis: Term Pregnancy Delivered                                              Post partum procedures:NA Augmentation: Pitocin Complications: None  Hospital course: Onset of Labor With Vaginal Delivery      39 y.o. yo G6P1142 at [redacted]w[redacted]d was admitted in Latent Labor on 01/07/2020. Patient had an uncomplicated labor course as follows:  Membrane Rupture Time/Date: 3:00 AM ,01/07/2020   Delivery Method:Vaginal, Spontaneous  Episiotomy: None  Lacerations:  None  Patient had an uncomplicated postpartum course.  She is ambulating, tolerating a regular diet, passing flatus, and urinating well. Patient is discharged home in stable condition on 01/10/20.  Newborn Data: Birth date:01/07/2020  Birth time:10:57 PM  Gender:Female  Living status:Living  Apgars:4 ,8  Weight:3314 g   Magnesium Sulfate received: No BMZ received: No Rhophylac:N/A MMR:N/A T-DaP:Given prenatally Flu: N/A Transfusion:No  Physical exam  Vitals:   01/09/20 0615 01/09/20 1400 01/09/20 2112 01/10/20 0620  BP: 124/67 118/71 124/66 124/67  Pulse: 71 80 85   Resp: 18 18 17 18  Temp: 98.1 F (36.7 C) 98.2 F (36.8 C) 98 F (36.7 C) 98.1 F (36.7 C)  TempSrc: Oral Oral Oral Oral  SpO2: 100% 99% 100% 99%  Weight:      Height:       General: alert, cooperative and no distress Lochia: appropriate Uterine Fundus: firm Incision: N/A DVT  Evaluation: No evidence of DVT seen on physical exam. Negative Homan's sign. No cords or calf tenderness. No significant calf/ankle edema. Labs: Lab Results  Component Value Date   WBC 13.3 (H) 01/08/2020   HGB 10.3 (L) 01/08/2020   HCT 31.8 (L) 01/08/2020   MCV 86.6 01/08/2020   PLT 171 01/08/2020   CMP Latest Ref Rng & Units 03/03/2019  Glucose 70 - 99 mg/dL 84  BUN 6 - 20 mg/dL 9  Creatinine 0.44 - 1.00 mg/dL 1.09(H)  Sodium 135 - 145 mmol/L 138  Potassium 3.5 - 5.1 mmol/L 3.7  Chloride 98 - 111 mmol/L 104  CO2 22 - 32 mmol/L 24  Calcium 8.9 - 10.3 mg/dL 9.3  Total Protein 6.0 - 8.3 g/dL -  Total Bilirubin 0.3 - 1.2 mg/dL -  Alkaline Phos 39 - 117 U/L -  AST 0 - 37 U/L -  ALT 0 - 35 U/L -   Edinburgh Score: Edinburgh Postnatal Depression Scale Screening Tool 01/10/2020  I have been able to laugh and see the funny side of things. 0  I have looked forward with enjoyment to things. 0  I have blamed myself unnecessarily when things went wrong. 1  I have been anxious or worried for no good reason. 2  I have felt scared or panicky for no good reason. 1  Things have been getting on   top of me. 1  I have been so unhappy that I have had difficulty sleeping. 0  I have felt sad or miserable. 1  I have been so unhappy that I have been crying. 1  The thought of harming myself has occurred to me. 0  Edinburgh Postnatal Depression Scale Total 7      After visit meds:  Allergies as of 01/10/2020      Reactions   Latex Itching   burning      Medication List    STOP taking these medications   PRENATAL VITAMINS PO   valACYclovir 500 MG tablet Commonly known as: VALTREX     TAKE these medications   ibuprofen 600 MG tablet Commonly known as: ADVIL Take 1 tablet (600 mg total) by mouth every 6 (six) hours.        Discharge home in stable condition Infant Feeding: Breast Infant Disposition:home with mother Discharge instruction: per After Visit Summary and Postpartum  booklet. Activity: Advance as tolerated. Pelvic rest for 6 weeks.  Diet: routine diet Anticipated Birth Control: Condoms Postpartum Appointment:6 weeks Additional Postpartum F/U: NA Future Appointments:No future appointments. Follow up Visit:  Follow-up Information    Ob/Gyn, [place] Follow up in 6 week(s).   Specialty: Obstetrics and Gynecology Contact information: [address] [place] [phone]                   01/10/2020 Ike Bene, CNM

## 2020-01-10 NOTE — Lactation Note (Addendum)
This note was copied from a Samantha's chart. Lactation Consultation Note  Patient Name: Samantha Swanson INOMV'E Date: 01/10/2020 Reason for consult: Follow-up assessment;Term;Hyperbilirubinemia;Infant weight loss (Infant with -5% weight loss.) P2, 67 hour ( day 3) term infant with -5% weight loss. Tools: DEBP  given per mom, she has not used the DEBP since yesterday, infant is being supplemented with 25 ml of donor milk per feeding. Concerns: LC is concern if mom had enough donor milk to supplement infant through out the night  or would need to be supplemented with formula at 3 days of life due to mom having low supply, infant not stool and having high bill level.  LC observed mom latched infant on her  left breast using the football,  LC noticed infant had a  shallow latch and kept slipping on the tip of mom's nipple. LC mention mom to hold infant at base of neck and shoulder and not head, this may be contributing to infant slipping down and not having a deeper latch when breastfeeding. LC did hand expression with mom and mom only expressed 2 mls of colostrum after much stimulation and  massage. LC worked on latching infant at breast with mom, infant was re-latched at the breast using 5 french feeding tube and given 25 mls of donor breast milk based on infant's age/ hours of life ( day 3). LC assisted mom in infant having a deeper latch with hand position at breast and support infant's position with football hold, infant breastfed for 40 minutes, audible swallows were heard.  LC stressed importance with mom using the DEBP every 3 hours for 15 minutes to help establish her supply.   Mom will call RN or LC  if she needs further assistance with latching infant at breast using the 5 french supplementing system. LC alerted RN that mom will need to supplement infant with donor breast milk or formula due to her low milk volume.      Maternal Data    Feeding Feeding Type: Breast Fed  LATCH  Score Latch: Grasps breast easily, tongue down, lips flanged, rhythmical sucking.  Audible Swallowing: Spontaneous and intermittent  Type of Nipple: Everted at rest and after stimulation  Comfort (Breast/Nipple): Soft / non-tender  Hold (Positioning): Assistance needed to correctly position infant at breast and maintain latch.  LATCH Score: 9  Interventions Interventions: Assisted with latch;Breast compression;Adjust position;Skin to skin;Breast massage;Support pillows;Hand express;Position options  Lactation Tools Discussed/Used     Consult Status Consult Status: Follow-up Date: 01/11/20 Follow-up type: In-patient    Danelle Earthly 01/10/2020, 6:46 PM

## 2020-01-10 NOTE — Lactation Note (Signed)
This note was copied from a Samantha's chart. Lactation Consultation Note  Patient Name: Samantha Swanson Date: 01/10/2020 Reason for consult: Follow-up assessment;Primapara;1st time breastfeeding;Term;Infant weight loss;Hyperbilirubinemia;Other (Comment) (no stool since mec at birth/ mom aware to page with feeding cues) Samantha is 58 hours old/ 5 % weight loss/ Serum Bili this am - 12.1 at 0624 - noted to be on the high risk.  LC reviewed and updated the doc flow sheets with mom.  Mom confirmed the Samantha has not had a stool yet.  Last feeding was 5 mins at the breast and prior to that 5 mins and 25 ml of the donor milk. Mom mentioned she has pumped x 2 since the DEBP was set up.  LC encouraged the post pumping after Samantha is settled with feeding after 5-6 feedings to enhance her milk coming in.  Per mom breast has been feeding achy. LC reassured mom that is a sign her milk is coming in .  Sore nipple and engorgement prevention and tx reviewed/ storage of breast milk.   LC encouraged mom to call with feeding cues for feeding assessment.    Maternal Data    Feeding Feeding Type:  (Samantha has recently fed and is asleep. mom aware to call for Latch assessment)  LATCH Score ( Latch score by [facility])  Latch: Grasps breast easily, tongue down, lips flanged, rhythmical sucking.  Audible Swallowing: A few with stimulation  Type of Nipple: Everted at rest and after stimulation  Comfort (Breast/Nipple): Soft / non-tender  Hold (Positioning): Assistance needed to correctly position infant at breast and maintain latch.  LATCH Score: 8  Interventions Interventions: Breast feeding basics reviewed  Lactation Tools Discussed/Used WIC Program: Yes Pump Review: Milk Storage   Consult Status Consult Status: Follow-up Date: 01/10/20 Follow-up type: In-patient    Samantha Swanson Samantha Swanson 01/10/2020, 9:20 AM

## 2020-01-11 ENCOUNTER — Ambulatory Visit: Payer: Self-pay

## 2020-01-11 NOTE — Lactation Note (Signed)
This note was copied from a Samantha's chart. Lactation Consultation Note  Patient Name: Samantha Swanson Date: 01/11/2020   Infant is 31 hrs old. Mom says her breasts are getting heavier. With her previous child (now 37 yo), Mom reports having been a "good milk maker." Mom notes that when infant is latched, infant seems to stop transferring milk around the 7-10 min mark. I asked Mom to then unlatch infant, offer the other breast & then supplement with EBM/formula until infant is content. Mom will pump whenever infant receives formula or supplement (Mom may consider doing informal milk sharing with her niece).  Mom has a pump at home.   Mom knows how to reach [place] for any post-discharge questions.  Lurline Hare [facility] 01/11/2020, 8:52 AM

## 2020-01-17 ENCOUNTER — Inpatient Hospital Stay (HOSPITAL_COMMUNITY): Payer: Medicaid Other

## 2020-01-17 ENCOUNTER — Inpatient Hospital Stay (HOSPITAL_COMMUNITY)
Admission: AD | Admit: 2020-01-17 | Payer: Medicaid Other | Source: Home / Self Care | Admitting: Obstetrics & Gynecology

## 2020-03-26 DIAGNOSIS — Z20822 Contact with and (suspected) exposure to covid-19: Secondary | ICD-10-CM | POA: Diagnosis not present

## 2020-04-02 DIAGNOSIS — Z20822 Contact with and (suspected) exposure to covid-19: Secondary | ICD-10-CM | POA: Diagnosis not present

## 2020-05-17 DIAGNOSIS — Z20822 Contact with and (suspected) exposure to covid-19: Secondary | ICD-10-CM | POA: Diagnosis not present

## 2020-05-26 DIAGNOSIS — Z20822 Contact with and (suspected) exposure to covid-19: Secondary | ICD-10-CM | POA: Diagnosis not present

## 2020-06-02 DIAGNOSIS — Z20822 Contact with and (suspected) exposure to covid-19: Secondary | ICD-10-CM | POA: Diagnosis not present

## 2020-06-22 DIAGNOSIS — Z20822 Contact with and (suspected) exposure to covid-19: Secondary | ICD-10-CM | POA: Diagnosis not present

## 2020-06-28 DIAGNOSIS — Z20822 Contact with and (suspected) exposure to covid-19: Secondary | ICD-10-CM | POA: Diagnosis not present

## 2020-07-08 DIAGNOSIS — Z20822 Contact with and (suspected) exposure to covid-19: Secondary | ICD-10-CM | POA: Diagnosis not present

## 2020-07-20 ENCOUNTER — Ambulatory Visit
Admission: EM | Admit: 2020-07-20 | Discharge: 2020-07-20 | Disposition: A | Discharge status: Home / Self Care | Payer: Medicaid Other | Attending: Urgent Care | Admitting: Urgent Care

## 2020-07-20 ENCOUNTER — Other Ambulatory Visit: Payer: Self-pay

## 2020-07-20 ENCOUNTER — Encounter: Payer: Self-pay | Admitting: Emergency Medicine

## 2020-07-20 DIAGNOSIS — R8290 Unspecified abnormal findings in urine: Secondary | ICD-10-CM | POA: Insufficient documentation

## 2020-07-20 DIAGNOSIS — N3001 Acute cystitis with hematuria: Secondary | ICD-10-CM | POA: Diagnosis not present

## 2020-07-20 DIAGNOSIS — R300 Dysuria: Secondary | ICD-10-CM | POA: Insufficient documentation

## 2020-07-20 LAB — POCT URINALYSIS DIP (MANUAL ENTRY)
Bilirubin, UA: NEGATIVE
Glucose, UA: NEGATIVE mg/dL
Ketones, POC UA: NEGATIVE mg/dL
Nitrite, UA: NEGATIVE
Protein Ur, POC: 30 mg/dL — AB
Spec Grav, UA: <=1.005 — AB (ref 1.010–1.025)
Urobilinogen, UA: 0.2 E.U./dL
pH, UA: 6 (ref 5.0–8.0)

## 2020-07-20 LAB — POCT URINE PREGNANCY: Preg Test, Ur: NEGATIVE

## 2020-07-20 MED ORDER — CEPHALEXIN 500 MG PO CAPS: 500.0000 mg | ORAL_CAPSULE | Freq: Two times a day (BID) | ORAL | 0 refills | Status: DC

## 2020-07-20 NOTE — Discharge Instructions (Signed)

## 2020-07-20 NOTE — ED Triage Notes (Signed)
Pt said pressure and pain with urination x 2 days. Today some blood with pressure.

## 2020-07-20 NOTE — ED Provider Notes (Signed)
[facility]   MRN: [id number] DOB: 14-Dec-1980  Subjective:   Samantha Swanson is a 40 y.o. female presenting for 2-day history of acute onset dysuria, urinary frequency, pressure about her pelvic area.  Is also started noticing blood.  Patient hydrates with about a gallon of water a day.  She is sexually active, does not use condoms for protection.  She did have a baby about 6 months ago.  There is possibility of pregnancy, LMP was 02-11.  Does not use contraception.  Denies fever, nausea, vomiting, pelvic or flank pain.  No current facility-administered medications for this encounter.  Current Outpatient Medications:  .  ibuprofen (ADVIL) 600 MG tablet, Take 1 tablet (600 mg total) by mouth every 6 (six) hours., Disp: 30 tablet, Rfl: 0   Allergies  Allergen Reactions  . Latex Itching    burning    Past Medical History:  Diagnosis Date  . Abnormal Pap smear   . Allergy   . Chronic headaches   . Depression    not on meds, ok now  . Family history of adverse reaction to anesthesia    "mother of patient does not do well with the gas"  . Genital herpes   . Heart murmur    no work up  . HSV infection   . ITP (idiopathic thrombocytopenic purpura)   . Migraines   . Ovarian cyst   . Sciatica of right side 11/28/2010  . Thrombocytopenia (HCC)   . UTI (lower urinary tract infection)   . Vaginal Pap smear, abnormal    no repeat     Past Surgical History:  Procedure Laterality Date  . INDUCED ABORTION     x2    Family History  Problem Relation Age of Onset  . Mental illness Other   . Hypertension Other   . Stroke Other   . Mental illness Other   . Cancer Other        colon cance and lung cancer  . Heart disease Other   . Mental illness Other   . Hypertension Mother   . Diabetes Mother   . Hypertension Father   . Anesthesia problems Neg Hx   . Other Neg Hx     Social History   Tobacco Use  . Smoking status: Former Smoker    Packs/day: 0.50     Years: 10.00    Pack years: 5.00    Types: Cigarettes  . Smokeless tobacco: Never Used  . Tobacco comment: 2018  Vaping Use  . Vaping Use: Never used  Substance Use Topics  . Alcohol use: Not Currently  . Drug use: Not Currently    Frequency: 5.0 times per week    Types: Marijuana    Comment: last  Oct 2020    ROS   Objective:   Vitals: BP 117/66 (BP Location: Right Arm)   Pulse 91   Temp 98.5 F (36.9 C) (Oral)   Resp 16   LMP 07/05/2020   SpO2 98%   Physical Exam Constitutional:      General: She is not in acute distress.    Appearance: Normal appearance. She is well-developed. She is not ill-appearing.  HENT:     Head: Normocephalic and atraumatic.     Nose: Nose normal.     Mouth/Throat:     Mouth: Mucous membranes are moist.     Pharynx: Oropharynx is clear.  Eyes:     General: No scleral icterus.    Extraocular Movements: Extraocular  movements intact.     Pupils: Pupils are equal, round, and reactive to light.  Cardiovascular:     Rate and Rhythm: Normal rate.  Pulmonary:     Effort: Pulmonary effort is normal.  Abdominal:     General: Bowel sounds are normal. There is no distension.     Palpations: Abdomen is soft. There is no mass.     Tenderness: There is no abdominal tenderness. There is no right CVA tenderness, left CVA tenderness, guarding or rebound.  Skin:    General: Skin is warm and dry.  Neurological:     General: No focal deficit present.     Mental Status: She is alert and oriented to person, place, and time.  Psychiatric:        Mood and Affect: Mood normal.        Behavior: Behavior normal.        Thought Content: Thought content normal.        Judgment: Judgment normal.     Results for orders placed or performed during the hospital encounter of 07/20/20 (from the past 24 hour(s))  POCT urinalysis dipstick     Status: Abnormal   Collection Time: 07/20/20 12:38 PM  Result Value Ref Range   Color, UA red (A) yellow   Clarity, UA  cloudy (A) clear   Glucose, UA negative negative mg/dL   Bilirubin, UA negative negative   Ketones, POC UA negative negative mg/dL   Spec Grav, UA <=5.035 (A) 1.010 - 1.025   Blood, UA large (A) negative   pH, UA 6.0 5.0 - 8.0   Protein Ur, POC =30 (A) negative mg/dL   Urobilinogen, UA 0.2 0.2 or 1.0 E.U./dL   Nitrite, UA Negative Negative   Leukocytes, UA Small (1+) (A) Negative  POCT urine pregnancy     Status: None   Collection Time: 07/20/20 12:46 PM  Result Value Ref Range   Preg Test, Ur Negative Negative    Assessment and Plan :   PDMP not reviewed this encounter.  1. Acute cystitis with hematuria   2. Dysuria   3. Cloudy urine     Start Keflex to cover for acute cystitis, urine culture pending.  We will also test for gonorrhea, chlamydia, trichomoniasis.  Recommended aggressive hydration, limiting urinary irritants. Counseled patient on potential for adverse effects with medications prescribed/recommended today, ER and return-to-clinic precautions discussed, patient verbalized understanding.    Wallis Bamberg, PA-C 07/20/20 1254

## 2020-07-21 LAB — CERVICOVAGINAL ANCILLARY ONLY
Chlamydia: NEGATIVE
Comment: NEGATIVE
Comment: NEGATIVE
Comment: NORMAL
Neisseria Gonorrhea: NEGATIVE
Trichomonas: NEGATIVE

## 2020-07-22 LAB — URINE CULTURE: Culture: >=100000 — AB

## 2021-01-03 ENCOUNTER — Ambulatory Visit (INDEPENDENT_AMBULATORY_CARE_PROVIDER_SITE_OTHER): Payer: Medicaid Other

## 2021-01-03 ENCOUNTER — Encounter: Payer: Self-pay | Admitting: Urgent Care

## 2021-01-03 ENCOUNTER — Other Ambulatory Visit: Payer: Self-pay

## 2021-01-03 ENCOUNTER — Ambulatory Visit
Admission: EM | Admit: 2021-01-03 | Discharge: 2021-01-03 | Disposition: A | Discharge status: Home / Self Care | Payer: Medicaid Other

## 2021-01-03 DIAGNOSIS — S99912A Unspecified injury of left ankle, initial encounter: Secondary | ICD-10-CM | POA: Diagnosis not present

## 2021-01-03 DIAGNOSIS — S93402A Sprain of unspecified ligament of left ankle, initial encounter: Secondary | ICD-10-CM

## 2021-01-03 DIAGNOSIS — M25572 Pain in left ankle and joints of left foot: Secondary | ICD-10-CM

## 2021-01-03 MED ORDER — NAPROXEN 375 MG PO TABS: 375.0000 mg | ORAL_TABLET | Freq: Two times a day (BID) | ORAL | 0 refills | Status: DC

## 2021-01-03 MED ORDER — IBUPROFEN 800 MG PO TABS
800.0000 mg | ORAL_TABLET | Freq: Once | ORAL | Status: AC
  Administered 2021-01-03: 800 mg via ORAL

## 2021-01-03 NOTE — ED Triage Notes (Signed)
Pt reports that she "smashed' her left foot inside on the elliptical machine at the gym this morning.  At the onset she reports nausea and feeling lightheaded for approx 20 min before resolving independently. No falls. No LOC.  As the day progresses Pt reports the pain and swelling has worsened. Pt has been wearing an ankle brace with some relief. Ambulates with a limp.No toe and plantar surface pain. No right foot sxs. Has been taking ibuprofen with temporary relief.

## 2021-01-03 NOTE — ED Provider Notes (Signed)
[facility]   MRN: [id number] DOB: 1981/02/23  Subjective:   Samantha Swanson is a 40 y.o. female presenting for suffering a left ankle injury today while exercising on the elliptical.  Patient states that it got caught on the machine and she ended up rolling it.  Has since had progressively worsening left ankle pain and swelling.  She did take ibuprofen 600 mg once today.  Patient has been able to bear weight however has pain with it.  No current facility-administered medications for this encounter.  Current Outpatient Medications:    cephALEXin (KEFLEX) 500 MG capsule, Take 1 capsule (500 mg total) by mouth 2 (two) times daily., Disp: 14 capsule, Rfl: 0   ibuprofen (ADVIL) 600 MG tablet, Take 1 tablet (600 mg total) by mouth every 6 (six) hours., Disp: 30 tablet, Rfl: 0   valACYclovir (VALTREX) 500 MG tablet, Take 500 mg by mouth daily., Disp: , Rfl:    Allergies  Allergen Reactions   Latex Itching    burning    Past Medical History:  Diagnosis Date   Abnormal Pap smear    Allergy    Chronic headaches    Depression    not on meds, ok now   Family history of adverse reaction to anesthesia    "mother of patient does not do well with the gas"   Genital herpes    Heart murmur    no work up   HSV infection    ITP (idiopathic thrombocytopenic purpura)    Migraines    Ovarian cyst    Sciatica of right side 11/28/2010   Thrombocytopenia (HCC)    UTI (lower urinary tract infection)    Vaginal Pap smear, abnormal    no repeat     Past Surgical History:  Procedure Laterality Date   INDUCED ABORTION     x2    Family History  Problem Relation Age of Onset   Mental illness Other    Hypertension Other    Stroke Other    Mental illness Other    Cancer Other        colon cance and lung cancer   Heart disease Other    Mental illness Other    Hypertension Mother    Diabetes Mother    Hypertension Father    Anesthesia problems Neg Hx    Other Neg Hx      Social History   Tobacco Use   Smoking status: Former    Packs/day: 0.50    Years: 10.00    Pack years: 5.00    Types: Cigarettes   Smokeless tobacco: Never   Tobacco comments:    2018  Vaping Use   Vaping Use: Never used  Substance Use Topics   Alcohol use: Not Currently   Drug use: Not Currently    Frequency: 5.0 times per week    Types: Marijuana    Comment: last  Oct 2020    ROS   Objective:   Vitals: BP (!) 106/47 (BP Location: Left Arm)   Pulse 85   Temp 98 F (36.7 C) (Oral)   Resp 18   LMP 12/30/2020 (Exact Date)   SpO2 99%   Breastfeeding Yes   Physical Exam Constitutional:      General: She is not in acute distress.    Appearance: Normal appearance. She is well-developed. She is not ill-appearing, toxic-appearing or diaphoretic.  HENT:     Head: Normocephalic and atraumatic.     Nose: Nose normal.  Mouth/Throat:     Mouth: Mucous membranes are moist.     Pharynx: Oropharynx is clear.  Eyes:     General: No scleral icterus.       Right eye: No discharge.        Left eye: No discharge.     Extraocular Movements: Extraocular movements intact.     Conjunctiva/sclera: Conjunctivae normal.     Pupils: Pupils are equal, round, and reactive to light.  Cardiovascular:     Rate and Rhythm: Normal rate.  Pulmonary:     Effort: Pulmonary effort is normal.  Musculoskeletal:     Left ankle: Swelling present. No deformity, ecchymosis or lacerations. Tenderness present over the ATF ligament and AITF ligament. No lateral malleolus, medial malleolus, CF ligament, posterior TF ligament, base of 5th metatarsal or proximal fibula tenderness. Decreased range of motion.     Left Achilles Tendon: No tenderness or defects. Thompson's test negative.  Skin:    General: Skin is warm and dry.  Neurological:     General: No focal deficit present.     Mental Status: She is alert and oriented to person, place, and time.     Motor: No weakness.     Coordination:  Coordination normal.     Gait: Gait normal.     Deep Tendon Reflexes: Reflexes normal.  Psychiatric:        Mood and Affect: Mood normal.        Behavior: Behavior normal.        Thought Content: Thought content normal.        Judgment: Judgment normal.    DG Ankle Complete Left  Result Date: 01/03/2021 CLINICAL DATA:  Injury EXAM: LEFT ANKLE COMPLETE - 3+ VIEW COMPARISON:  None. FINDINGS: There is no evidence of fracture, dislocation, or joint effusion. There is no evidence of arthropathy or other focal bone abnormality. Soft tissues are unremarkable. IMPRESSION: Negative. Electronically Signed   By: Jasmine Pang M.D.   On: 01/03/2021 18:50    Left ankle wrapped using 4" Ace wrap in figure-8 method.   Assessment and Plan :   PDMP not reviewed this encounter.  1. Acute left ankle pain   2. Sprain of left ankle, unspecified ligament, initial encounter     Will manage for ankle sprain with rice method, NSAID. Counseled patient on potential for adverse effects with medications prescribed/recommended today, ER and return-to-clinic precautions discussed, patient verbalized understanding.    Wallis Bamberg, [place] 01/03/21 1912

## 2021-03-29 ENCOUNTER — Ambulatory Visit
Admission: EM | Admit: 2021-03-29 | Discharge: 2021-03-29 | Disposition: A | Discharge status: Home / Self Care | Payer: Medicaid Other | Attending: Physician Assistant | Admitting: Physician Assistant

## 2021-03-29 DIAGNOSIS — N898 Other specified noninflammatory disorders of vagina: Secondary | ICD-10-CM | POA: Diagnosis not present

## 2021-03-29 LAB — POCT URINALYSIS DIP (MANUAL ENTRY)
Bilirubin, UA: NEGATIVE
Blood, UA: NEGATIVE
Glucose, UA: NEGATIVE mg/dL
Leukocytes, UA: NEGATIVE
Nitrite, UA: NEGATIVE
Protein Ur, POC: NEGATIVE mg/dL
Spec Grav, UA: 1.02 (ref 1.010–1.025)
Urobilinogen, UA: 0.2 E.U./dL
pH, UA: 6.5 (ref 5.0–8.0)

## 2021-03-29 NOTE — ED Triage Notes (Signed)
Pt c/o vaginal odor.   Denies discharge, pelvic pain, lower back pain, frequency, burning, itching.   States last month a few days before cycle there was malodor. Odor has not resolved since.

## 2021-03-29 NOTE — ED Provider Notes (Signed)
[facility]    CSN: [id number] Arrival date & time: 03/29/21  1701      History   Chief Complaint Chief Complaint  Patient presents with   vaginal symptoms    HPI Samantha Swanson is a 40 y.o. female.   Patient here today for evaluation of vaginal discharge and vaginal odor.  Symptoms have been present for about the last month.  She denies any dysuria, urinary frequency or abdominal pain.  She does not report any treatment for symptoms.  The history is provided by the patient.   Past Medical History:  Diagnosis Date   Abnormal Pap smear    Allergy    Chronic headaches    Depression    not on meds, ok now   Family history of adverse reaction to anesthesia    "mother of patient does not do well with the gas"   Genital herpes    Heart murmur    no work up   HSV infection    ITP (idiopathic thrombocytopenic purpura)    Migraines    Ovarian cyst    Sciatica of right side 11/28/2010   Thrombocytopenia (HCC)    UTI (lower urinary tract infection)    Vaginal Pap smear, abnormal    no repeat    Patient Active Problem List   Diagnosis Date Noted   Postpartum care following vaginal delivery 8/18 01/09/2020   Normal labor and delivery 01/07/2020   HSV (herpes simplex virus) infection 01/07/2020   SVD (8/18) 01/07/2020   Anxiety 11/28/2010   Depression    ITP (idiopathic thrombocytopenic purpura)    Migraines    Genital herpes     Past Surgical History:  Procedure Laterality Date   INDUCED ABORTION     x2    OB History     Gravida  6   Para  2   Term  1   Preterm  1   AB  4   Living  2      SAB  2   IAB  2   Ectopic  0   Multiple  0   Live Births  2            Home Medications    Prior to Admission medications   Medication Sig Start Date End Date Taking? Authorizing Provider  cephALEXin (KEFLEX) 500 MG capsule Take 1 capsule (500 mg total) by mouth 2 (two) times daily. 07/20/20   Wallis Bamberg, PA-C  ibuprofen (ADVIL) 600  MG tablet Take 1 tablet (600 mg total) by mouth every 6 (six) hours. 01/10/20   Crumpler, Lilyan Gilford, CNM  naproxen (NAPROSYN) 375 MG tablet Take 1 tablet (375 mg total) by mouth 2 (two) times daily with a meal. 01/03/21   Wallis Bamberg, PA-C  valACYclovir (VALTREX) 500 MG tablet Take 500 mg by mouth daily. 10/25/20   [provider]    Family History Family History  Problem Relation Age of Onset   Mental illness Other    Hypertension Other    Stroke Other    Mental illness Other    Cancer Other        colon cance and lung cancer   Heart disease Other    Mental illness Other    Hypertension Mother    Diabetes Mother    Hypertension Father    Anesthesia problems Neg Hx    Other Neg Hx     Social History Social History   Tobacco Use  Smoking status: Former    Packs/day: 0.50    Years: 10.00    Pack years: 5.00    Types: Cigarettes   Smokeless tobacco: Never   Tobacco comments:    2018  Vaping Use   Vaping Use: Never used  Substance Use Topics   Alcohol use: Not Currently   Drug use: Not Currently    Frequency: 5.0 times per week    Types: Marijuana    Comment: last  Oct 2020     Allergies   Latex   Review of Systems Review of Systems  Constitutional:  Negative for chills and fever.  Eyes:  Negative for discharge and redness.  Respiratory:  Negative for shortness of breath.   Gastrointestinal:  Negative for abdominal pain, nausea and vomiting.  Genitourinary:  Positive for vaginal bleeding and vaginal discharge. Negative for dysuria and frequency.    Physical Exam Triage Vital Signs ED Triage Vitals [03/29/21 1747]  Enc Vitals Group     BP 123/78     Pulse Rate 83     Resp 18     Temp 98 F (36.7 C)     Temp Source Oral     SpO2 98 %     Weight      Height      Head Circumference      Peak Flow      Pain Score 0     Pain Loc      Pain Edu?      Excl. in GC?    No data found.  Updated Vital Signs BP 123/78 (BP Location: Right Arm)    Pulse 83   Temp 98 F (36.7 C) (Oral)   Resp 18   LMP 03/01/2021 (Exact Date)   SpO2 98%   Breastfeeding Yes     Physical Exam Vitals and nursing note reviewed.  Constitutional:      General: She is not in acute distress.    Appearance: Normal appearance. She is not ill-appearing.  HENT:     Head: Normocephalic and atraumatic.  Eyes:     Conjunctiva/sclera: Conjunctivae normal.  Cardiovascular:     Rate and Rhythm: Normal rate.  Pulmonary:     Effort: Pulmonary effort is normal.  Neurological:     Mental Status: She is alert.  Psychiatric:        Mood and Affect: Mood normal.        Behavior: Behavior normal.        Thought Content: Thought content normal.     UC Treatments / Results  Labs (all labs ordered are listed, but only abnormal results are displayed) Labs Reviewed  POCT URINALYSIS DIP (MANUAL ENTRY) - Abnormal; Notable for the following components:      Result Value   Ketones, POC UA small (15) (*)    All other components within normal limits  CERVICOVAGINAL ANCILLARY ONLY    EKG   Radiology No results found.  Procedures Procedures (including critical care time)  Medications Ordered in UC Medications - No data to display  Initial Impression / Assessment and Plan / UC Course  I have reviewed the triage vital signs and the nursing notes.  Pertinent labs & imaging results that were available during my care of the patient were reviewed by me and considered in my medical decision making (see chart for details).  STD screening ordered as well as BV and yeast screening.  Will await results of further recommendation.  Encouraged patient to check MyChart  for results.  Final Clinical Impressions(s) / UC Diagnoses   Final diagnoses:  Vaginal odor   Discharge Instructions   None    ED Prescriptions   None    PDMP not reviewed this encounter.   Tomi Bamberger, PA-C 03/29/21 1846

## 2021-03-30 ENCOUNTER — Telehealth (HOSPITAL_COMMUNITY): Payer: Self-pay | Admitting: Emergency Medicine

## 2021-03-30 LAB — CERVICOVAGINAL ANCILLARY ONLY
Bacterial Vaginitis (gardnerella): POSITIVE — AB
Candida Glabrata: NEGATIVE
Candida Vaginitis: POSITIVE — AB
Chlamydia: NEGATIVE
Comment: NEGATIVE
Comment: NEGATIVE
Comment: NEGATIVE
Comment: NEGATIVE
Comment: NEGATIVE
Comment: NORMAL
Neisseria Gonorrhea: NEGATIVE
Trichomonas: NEGATIVE

## 2021-03-30 MED ORDER — METRONIDAZOLE 0.75 % VA GEL: 1.0000 | Freq: Every day | VAGINAL | 0 refills | Status: AC

## 2021-03-30 MED ORDER — TERCONAZOLE 0.4 % VA CREA: 1.0000 | TOPICAL_CREAM | Freq: Every day | VAGINAL | 0 refills | Status: AC

## 2021-04-18 ENCOUNTER — Inpatient Hospital Stay (HOSPITAL_COMMUNITY)
Admission: AD | Admit: 2021-04-18 | Discharge: 2021-04-18 | Disposition: A | Discharge status: Home / Self Care | Payer: Medicaid Other | Attending: Obstetrics and Gynecology | Admitting: Obstetrics and Gynecology

## 2021-04-18 ENCOUNTER — Other Ambulatory Visit: Payer: Self-pay

## 2021-04-18 ENCOUNTER — Encounter (HOSPITAL_COMMUNITY): Payer: Self-pay | Admitting: Obstetrics and Gynecology

## 2021-04-18 ENCOUNTER — Inpatient Hospital Stay (HOSPITAL_COMMUNITY): Payer: Medicaid Other

## 2021-04-18 DIAGNOSIS — N760 Acute vaginitis: Secondary | ICD-10-CM

## 2021-04-18 DIAGNOSIS — O26891 Other specified pregnancy related conditions, first trimester: Secondary | ICD-10-CM | POA: Insufficient documentation

## 2021-04-18 DIAGNOSIS — O26899 Other specified pregnancy related conditions, unspecified trimester: Secondary | ICD-10-CM

## 2021-04-18 DIAGNOSIS — B9689 Other specified bacterial agents as the cause of diseases classified elsewhere: Secondary | ICD-10-CM | POA: Diagnosis not present

## 2021-04-18 DIAGNOSIS — R1030 Lower abdominal pain, unspecified: Secondary | ICD-10-CM | POA: Insufficient documentation

## 2021-04-18 DIAGNOSIS — R109 Unspecified abdominal pain: Secondary | ICD-10-CM

## 2021-04-18 DIAGNOSIS — O09521 Supervision of elderly multigravida, first trimester: Secondary | ICD-10-CM | POA: Insufficient documentation

## 2021-04-18 DIAGNOSIS — Z3491 Encounter for supervision of normal pregnancy, unspecified, first trimester: Secondary | ICD-10-CM | POA: Diagnosis not present

## 2021-04-18 DIAGNOSIS — Z3A01 Less than 8 weeks gestation of pregnancy: Secondary | ICD-10-CM | POA: Diagnosis not present

## 2021-04-18 DIAGNOSIS — O23591 Infection of other part of genital tract in pregnancy, first trimester: Secondary | ICD-10-CM | POA: Insufficient documentation

## 2021-04-18 DIAGNOSIS — O3481 Maternal care for other abnormalities of pelvic organs, first trimester: Secondary | ICD-10-CM | POA: Diagnosis not present

## 2021-04-18 LAB — WET PREP, GENITAL
Sperm: NONE SEEN
Trich, Wet Prep: NONE SEEN
WBC, Wet Prep HPF POC: <10 (ref <10)
Yeast Wet Prep HPF POC: NONE SEEN

## 2021-04-18 LAB — URINALYSIS, ROUTINE W REFLEX MICROSCOPIC
Bilirubin Urine: NEGATIVE
Glucose, UA: NEGATIVE mg/dL
Hgb urine dipstick: NEGATIVE
Ketones, ur: NEGATIVE mg/dL
Leukocytes,Ua: NEGATIVE
Nitrite: NEGATIVE
Protein, ur: NEGATIVE mg/dL
Specific Gravity, Urine: 1.003 — ABNORMAL LOW (ref 1.005–1.030)
pH: 8 (ref 5.0–8.0)

## 2021-04-18 LAB — CBC
HCT: 38.3 % (ref 36.0–46.0)
Hemoglobin: 12.8 g/dL (ref 12.0–15.0)
MCH: 29.2 pg (ref 26.0–34.0)
MCHC: 33.4 g/dL (ref 30.0–36.0)
MCV: 87.2 fL (ref 80.0–100.0)
Platelets: 153 10*3/uL (ref 150–400)
RBC: 4.39 MIL/uL (ref 3.87–5.11)
RDW: 13.1 % (ref 11.5–15.5)
WBC: 5 10*3/uL (ref 4.0–10.5)
nRBC: 0 % (ref 0.0–0.2)

## 2021-04-18 LAB — HCG, QUANTITATIVE, PREGNANCY: hCG, Beta Chain, Quant, S: 13406 m[IU]/mL — ABNORMAL HIGH (ref <5)

## 2021-04-18 LAB — ABO/RH: ABO/RH(D): O POS

## 2021-04-18 LAB — POCT PREGNANCY, URINE: Preg Test, Ur: POSITIVE — AB

## 2021-04-18 MED ORDER — METRONIDAZOLE 0.75 % VA GEL: 1.0000 | Freq: Every day | VAGINAL | 1 refills | Status: DC

## 2021-04-18 NOTE — MAU Note (Signed)
Presents stating she has vaginal discharge and odor, denies itching.  States seen in Urgent Care 10 days ago, treated for BV, reports odor has improved and now has increase in vaginal discharge.  Denies VB.  LMP 03/01/2021.

## 2021-04-18 NOTE — MAU Provider Note (Signed)
History     CSN: [id number]  Arrival date and time: 04/18/21 1705   Event Date/Time   First Provider Initiated Contact with Patient 04/18/21 1842      Chief Complaint  Patient presents with   Vaginal Discharge   Vaginal Odor   HPI Samantha Swanson is a 40 y.o. [id number] at [redacted]w[redacted]d by LMP who presents to MAU for vaginal discharge and abdominal pain. Patient reports she was recently treated for BV and yeast infection, completed medications. Odor has improved however continues to have discharge. She also reports some intermittent lower abdominal pain that she describes as a "pinch and the same feeling you get when you hold your pee". She denies vaginal bleeding, urinary s/s. No nausea, vomiting, diarrhea, or constipation. LMP was 03/01/21.   OB History     Gravida  7   Para  2   Term  1   Preterm  1   AB  4   Living  2      SAB  2   IAB  2   Ectopic  0   Multiple  0   Live Births  2           Past Medical History:  Diagnosis Date   Abnormal Pap smear    Allergy    Chronic headaches    Depression    not on meds, ok now   Family history of adverse reaction to anesthesia    "mother of patient does not do well with the gas"   Genital herpes    Heart murmur    no work up   HSV infection    ITP (idiopathic thrombocytopenic purpura)    Migraines    Ovarian cyst    Sciatica of right side 11/28/2010   Thrombocytopenia (HCC)    UTI (lower urinary tract infection)    Vaginal Pap smear, abnormal    no repeat    Past Surgical History:  Procedure Laterality Date   INDUCED ABORTION     x2    Family History  Problem Relation Age of Onset   Heart disease Mother    Hypertension Mother    Diabetes Mother    Osteoporosis Mother    Hypertension Father    Mental illness Other    Hypertension Other    Stroke Other    Mental illness Other    Cancer Other        colon cance and lung cancer   Heart disease Other    Mental illness Other    Anesthesia problems Neg  Hx    Other Neg Hx     Social History   Tobacco Use   Smoking status: Former    Packs/day: 0.50    Years: 10.00    Pack years: 5.00    Types: Cigarettes   Smokeless tobacco: Never   Tobacco comments:    2018  Vaping Use   Vaping Use: Never used  Substance Use Topics   Alcohol use: Not Currently   Drug use: Not Currently    Frequency: 5.0 times per week    Types: Marijuana    Comment: last  Oct 2020    Allergies:  Allergies  Allergen Reactions   Latex Itching    burning    Medications Prior to Admission  Medication Sig Dispense Refill Last Dose   valACYclovir (VALTREX) 500 MG tablet Take 500 mg by mouth daily.   Past Week   cephALEXin (KEFLEX) 500 MG capsule Take 1 capsule (  500 mg total) by mouth 2 (two) times daily. 14 capsule 0    ibuprofen (ADVIL) 600 MG tablet Take 1 tablet (600 mg total) by mouth every 6 (six) hours. 30 tablet 0    naproxen (NAPROSYN) 375 MG tablet Take 1 tablet (375 mg total) by mouth 2 (two) times daily with a meal. 30 tablet 0     Review of Systems  Constitutional: Negative.   Respiratory: Negative.    Cardiovascular: Negative.   Gastrointestinal:  Positive for abdominal pain. Negative for constipation, diarrhea, nausea and vomiting.  Genitourinary:  Positive for vaginal discharge. Negative for dysuria, frequency and vaginal bleeding.       Vaginal odor  Musculoskeletal: Negative.   Neurological: Negative.    Physical Exam   Blood pressure 117/72, pulse 77, temperature 97.8 F (36.6 C), temperature source Oral, resp. rate 20, height 5\' 4"  (1.626 m), weight 86.3 kg, last menstrual period 03/01/2021, SpO2 100 %, currently breastfeeding.  Physical Exam Constitutional:      General: She is not in acute distress. Eyes:     Pupils: Pupils are equal, round, and reactive to light.  Cardiovascular:     Rate and Rhythm: Normal rate.  Pulmonary:     Effort: Pulmonary effort is normal.  Abdominal:     Palpations: Abdomen is soft.      Tenderness: There is no abdominal tenderness.  Genitourinary:    Comments: Patient self-swabbed Skin:    General: Skin is warm and dry.  Neurological:     General: No focal deficit present.     Mental Status: She is alert and oriented to person, place, and time.  Psychiatric:        Mood and Affect: Mood normal.        Behavior: Behavior normal.        Thought Content: Thought content normal.        Judgment: Judgment normal.    MAU Course  Procedures  MDM UA unremarkable CBC, HCG, ABO/RH Wet prep, GC/CT collected 05/01/2021 with IUP  Assessment and Plan  [redacted] weeks gestation of pregnancy Vaginal discharge Abdominal pain affecting pregnancy Bacterial vaginosis  - Discharge in stable condition - Rx for metrogel sent to pharmacy - Call [facility] to establish prenatal care - Strict return precautions given. Return to MAU as needed   [place], CNM 04/18/2021, 7:44 PM

## 2021-04-18 NOTE — Discharge Instructions (Signed)
[facility]   [facility]             [address], [place], [place] [address] [phone]  [facility]                                                             [address], [address], [place], [place], [address] [phone]  [facility]                                    [address], [address], [place], [place], [address] [phone]  [facility] [place] [address], [address], [place], [place], [address] [phone]  [facility]                                 [address], [place], [place], [address] [phone]  [facility]                                    [address], [place], [place], [address] [phone]  [facility] [address], [address], [place], [place], [address]                              [facility] [address], [address], [place], , [address] [phone]  [facility]         Phone: [phone]  [facility]      Phone: [phone]   [facility]      Phone: [phone]  [facility]         Phone: [phone]   [facility]    Phone: [phone]  [facility]      Phone: [phone]  [facility]     Phone: [phone]  [facility]        Phone: [phone]  [facility]      Phone: [phone]  

## 2021-04-18 NOTE — MAU Note (Addendum)
"  Knew it, my cycle is never late, +HPT". Pt still breast feeding.

## 2021-04-19 LAB — GC/CHLAMYDIA PROBE AMP (~~LOC~~) NOT AT ARMC
Chlamydia: NEGATIVE
Comment: NEGATIVE
Comment: NORMAL
Neisseria Gonorrhea: NEGATIVE

## 2021-05-03 ENCOUNTER — Other Ambulatory Visit: Payer: Self-pay

## 2021-05-03 ENCOUNTER — Inpatient Hospital Stay (HOSPITAL_COMMUNITY): Payer: Medicaid Other

## 2021-05-03 ENCOUNTER — Inpatient Hospital Stay (HOSPITAL_COMMUNITY)
Admission: AD | Admit: 2021-05-03 | Discharge: 2021-05-04 | Disposition: A | Discharge status: Home / Self Care | Payer: Medicaid Other | Attending: Obstetrics and Gynecology | Admitting: Obstetrics and Gynecology

## 2021-05-03 DIAGNOSIS — O209 Hemorrhage in early pregnancy, unspecified: Secondary | ICD-10-CM

## 2021-05-03 DIAGNOSIS — Z3A01 Less than 8 weeks gestation of pregnancy: Secondary | ICD-10-CM | POA: Diagnosis not present

## 2021-05-03 DIAGNOSIS — Z3A09 9 weeks gestation of pregnancy: Secondary | ICD-10-CM

## 2021-05-03 DIAGNOSIS — O021 Missed abortion: Secondary | ICD-10-CM | POA: Insufficient documentation

## 2021-05-03 NOTE — MAU Note (Signed)
PT SAYS HAS LOWER BACK PAIN- STARTED AT 930.  WENT TO B-ROOM - RED/ BROWN  BLOOD WHEN SHE WIPES

## 2021-05-03 NOTE — MAU Provider Note (Signed)
Chief Complaint: Back Pain and Vaginal Bleeding   Event Date/Time   First Provider Initiated Contact with Patient 05/03/21 2319        SUBJECTIVE HPI: Samantha Swanson is a 40 y.o. [id number] at [redacted]w[redacted]d by LMP who presents to maternity admissions reporting low back pain and bleeding on tissue.  States when she woke up today she "didn't feel pregnant anymore". She denies urinary symptoms, h/a, dizziness, n/v, or fever/chills.    Back Pain This is a new problem. The current episode started today. The pain is present in the lumbar spine. The quality of the pain is described as aching and burning. Pertinent negatives include no dysuria, fever, paresis or weakness.  Vaginal Bleeding The patient's primary symptoms include vaginal bleeding. The patient's pertinent negatives include no genital itching, genital lesions or genital odor. This is a new problem. The current episode started today. The problem occurs intermittently. The problem has been unchanged. She is pregnant. Associated symptoms include back pain. Pertinent negatives include no dysuria or fever. The vaginal discharge was bloody. The vaginal bleeding is lighter than menses. She has not been passing clots. She has not been passing tissue. Nothing aggravates the symptoms. She has tried nothing for the symptoms.  RN note: PT SAYS HAS LOWER BACK PAIN- STARTED AT 930.  WENT TO B-ROOM - RED/ BROWN  BLOOD WHEN SHE WIPES    Past Medical History:  Diagnosis Date   Abnormal Pap smear    Allergy    Chronic headaches    Depression    not on meds, ok now   Family history of adverse reaction to anesthesia    "mother of patient does not do well with the gas"   Genital herpes    Heart murmur    no work up   HSV infection    ITP (idiopathic thrombocytopenic purpura)    Migraines    Ovarian cyst    Sciatica of right side 11/28/2010   Thrombocytopenia (HCC)    UTI (lower urinary tract infection)    Vaginal Pap smear, abnormal    no repeat   Past  Surgical History:  Procedure Laterality Date   INDUCED ABORTION     x2   Social History   Socioeconomic History   Marital status: Single    Spouse name: Not on file   Number of children: Not on file   Years of education: 16   Highest education level: Not on file  Occupational History   Occupation: IT consultant  Tobacco Use   Smoking status: Former    Packs/day: 0.50    Years: 10.00    Pack years: 5.00    Types: Cigarettes   Smokeless tobacco: Never   Tobacco comments:    2018  Vaping Use   Vaping Use: Never used  Substance and Sexual Activity   Alcohol use: Not Currently   Drug use: Not Currently    Frequency: 5.0 times per week    Types: Marijuana    Comment: last  Oct 2020   Sexual activity: Yes    Birth control/protection: None  Other Topics Concern   Not on file  Social History Narrative   Not on file   Social Determinants of Health   Financial Resource Strain: Not on file  Food Insecurity: Not on file  Transportation Needs: Not on file  Physical Activity: Not on file  Stress: Not on file  Social Connections: Not on file  Intimate Partner Violence: Not on file   No  current facility-administered medications on file prior to encounter.   Current Outpatient Medications on File Prior to Encounter  Medication Sig Dispense Refill   metroNIDAZOLE (METROGEL) 0.75 % vaginal gel Place 1 Applicatorful vaginally at bedtime. Apply one applicatorful to vagina at bedtime for 5 days 70 g 1   valACYclovir (VALTREX) 500 MG tablet Take 500 mg by mouth daily.     Allergies  Allergen Reactions   Latex Itching    burning    I have reviewed patient's Past Medical Hx, Surgical Hx, Family Hx, Social Hx, medications and allergies.   ROS:  Review of Systems  Constitutional:  Negative for fever.  Genitourinary:  Positive for vaginal bleeding. Negative for dysuria.  Musculoskeletal:  Positive for back pain.  Neurological:  Negative for weakness.  Review of Systems  Other  systems negative   Physical Exam  Physical Exam Patient Vitals for the past 24 hrs:  BP Temp Temp src Pulse Resp Height Weight  05/03/21 2240 109/68 98.1 F (36.7 C) Oral 86 20 5\' 4"  (1.626 m) 85 kg   Constitutional: Well-developed, well-nourished female in no acute distress.  Cardiovascular: normal rate Respiratory: normal effort GI: Abd soft, non-tender. Pos BS x 4 MS: Extremities nontender, no edema, normal ROM Neurologic: Alert and oriented x 4.  GU: Neg CVAT.  PELVIC EXAM: Cervix pink, visually closed, without lesion, small dark red blood in vault, cervix closed   LAB RESULTS No results found for this or any previous visit (from the past 24 hour(s)).  --/--/O POS (11/28 1837)  IMAGING 10-13-2000 OB Transvaginal  Result Date: 05/04/2021 CLINICAL DATA:  Pregnant, back pain, vaginal bleeding EXAM: TRANSVAGINAL OB ULTRASOUND TECHNIQUE: Transvaginal ultrasound was performed for complete evaluation of the gestation as well as the maternal uterus, adnexal regions, and pelvic cul-de-sac. COMPARISON:  04/10/2021 FINDINGS: Intrauterine gestational sac: Single Yolk sac:  Not Visualized. Embryo:  Visualized. Cardiac Activity: Not Visualized. CRL:   12.9 mm   7 w 3 d Subchorionic hemorrhage:  None visualized. Maternal uterus/adnexae: Bilateral ovaries are within normal limits. No free fluid. IMPRESSION: Single IUP without cardiac activity. Findings meet definitive criteria for failed pregnancy. This follows SRU consensus guidelines: Diagnostic Criteria for Nonviable Pregnancy Early in the First Trimester. 04/12/2021 J Med [phone]. Electronically Signed   By: [id number] M.D.   On: 05/04/2021 00:12     MAU Management/MDM: Ordered followup Ultrasound to rule out SAB or subchorionic hemorrhage.   This 05/06/2021 showed loss of fetal cardiac activity. Discussed with patient who is very saddened by this news.  Discussed options of expectant management, cytotec and D&C.  She wants prescription of  Cytotec to use at home Warning signs reviewed Since [facility] did not make her an appointment, we will follow her in our [place] office   ASSESSMENT Single IUP at [redacted]w[redacted]d Missed abortion  PLAN Discharge home Rx Cytotec for missed ab Rx Phenergan for nausea Rx Percocet for pain Message sent to [place] office for followup  Pt stable at time of discharge. Encouraged to return here if she develops worsening of symptoms, increase in pain, fever, or other concerning symptoms.    [redacted]w[redacted]d CNM, MSN Certified Nurse-Midwife 05/03/2021  11:20 PM

## 2021-05-04 ENCOUNTER — Telehealth: Payer: Self-pay | Admitting: General Practice

## 2021-05-04 ENCOUNTER — Telehealth: Payer: Self-pay | Admitting: Family Medicine

## 2021-05-04 DIAGNOSIS — Z3A01 Less than 8 weeks gestation of pregnancy: Secondary | ICD-10-CM | POA: Diagnosis not present

## 2021-05-04 DIAGNOSIS — Z3A09 9 weeks gestation of pregnancy: Secondary | ICD-10-CM | POA: Diagnosis not present

## 2021-05-04 DIAGNOSIS — O209 Hemorrhage in early pregnancy, unspecified: Secondary | ICD-10-CM | POA: Diagnosis not present

## 2021-05-04 DIAGNOSIS — O021 Missed abortion: Secondary | ICD-10-CM | POA: Diagnosis not present

## 2021-05-04 MED ORDER — OXYCODONE-ACETAMINOPHEN 5-325 MG PO TABS
1.0000 | ORAL_TABLET | Freq: Four times a day (QID) | ORAL | 0 refills | Status: DC | PRN
Start: 2021-05-04 — End: 2023-12-05

## 2021-05-04 MED ORDER — PROMETHAZINE HCL 25 MG PO TABS: 25.0000 mg | ORAL_TABLET | Freq: Four times a day (QID) | ORAL | 2 refills | Status: DC | PRN

## 2021-05-04 MED ORDER — MISOPROSTOL 200 MCG PO TABS: ORAL_TABLET | ORAL | 1 refills | Status: DC

## 2021-05-04 NOTE — Telephone Encounter (Signed)
Pharmacy called regarding lowering a medication the patient is taking and wanted to speak with a nurse to get this done.

## 2021-05-04 NOTE — Telephone Encounter (Signed)
Called pharmacy who states the problem is with the oxycodone prescription. They cannot fill it as written. The prescription needs to say every 8 hours or one every 6 hours. Told pharmacist to change the prescription to 1 every 6 hours. They stated yes and they would get the prescription ready.

## 2021-05-04 NOTE — Telephone Encounter (Signed)
Left message on VM for patient to contact our office to schedule f/u in 2-4 weeks per Wynelle Bourgeois, CNM.

## 2021-05-04 NOTE — Telephone Encounter (Signed)
-----   Message from Aviva Signs, CNM sent at 05/04/2021  1:03 AM EST ----- Regarding: Needs SAB followup appt I saw her in MAU for missed abortion (incomplete miscarriage)  She has not been able to get into an OB yet, so I am doing her post miscarriage visit at [facility] because she lives nearby  It can be with any provider in 2-4 weeks   call her when you can schedule it  Thanks  Hilda Lias

## 2021-05-21 IMAGING — US US MFM OB FOLLOW-UP
1 series · 13 of 28 positions shown · non-contrast
Comparison: none

[Series 1: us mfm ob follow-up · 13 of 75 slices shown]
[im 3/75]
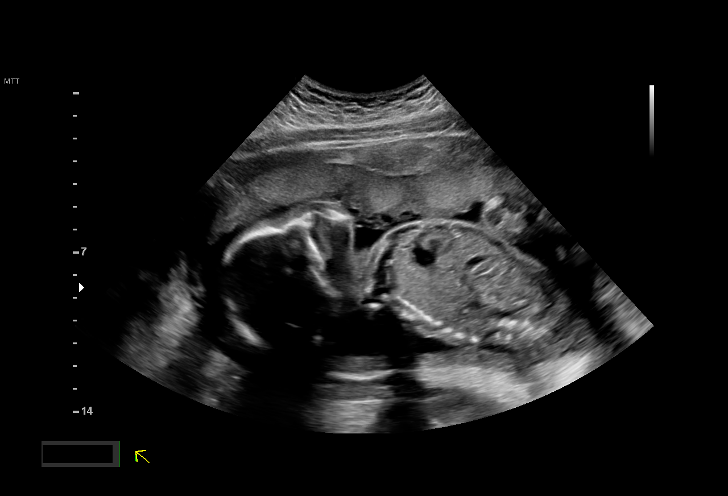
[im 9/75]
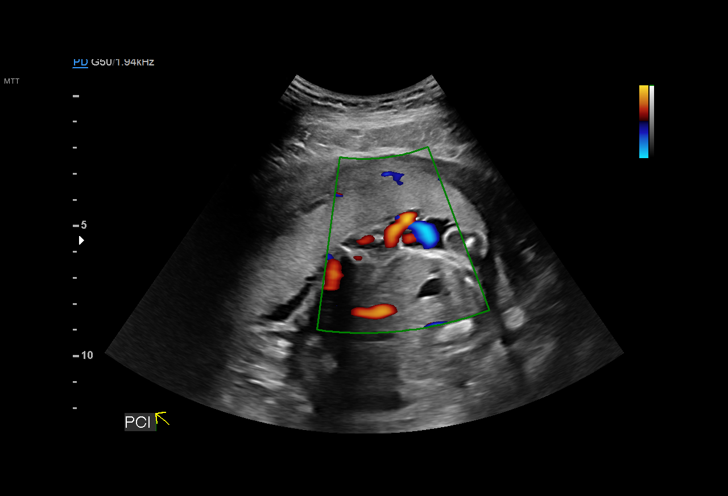
[im 14/75]
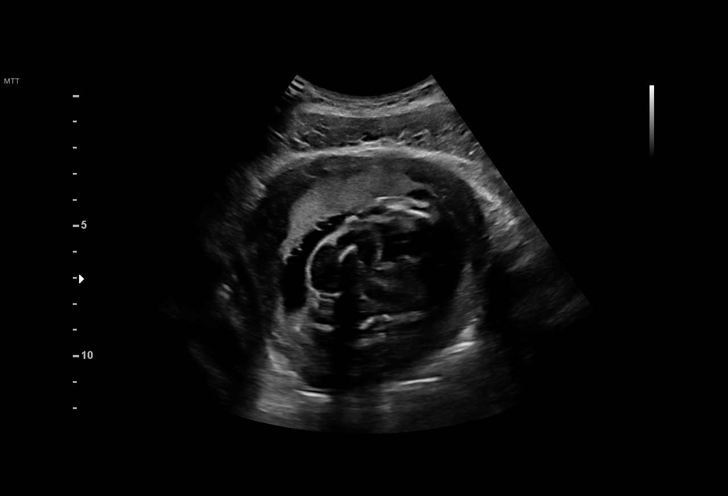
[im 20/75]
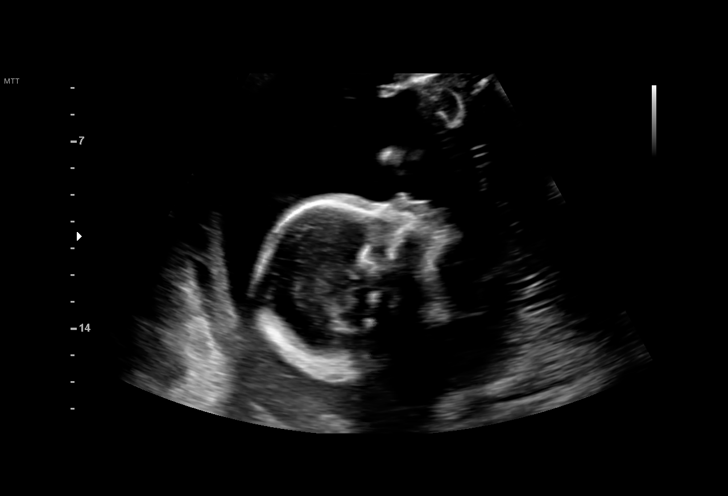
[im 25/75]
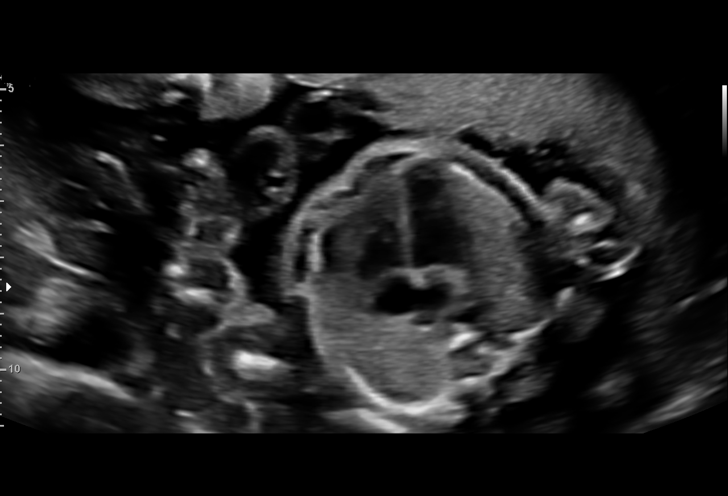
[im 31/75]
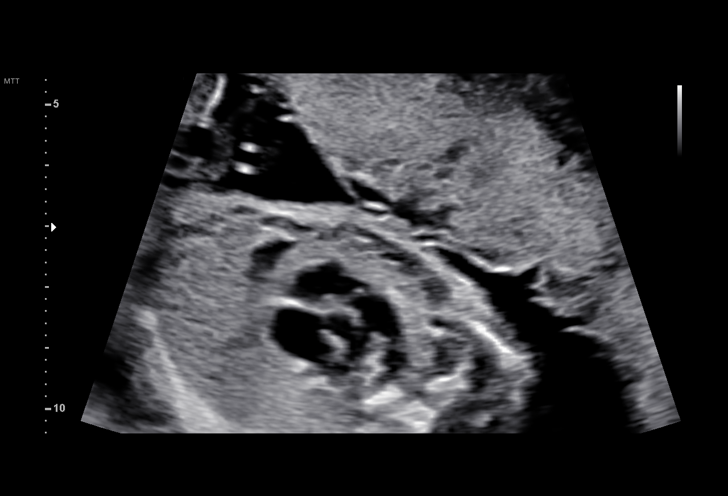
[im 39/75]
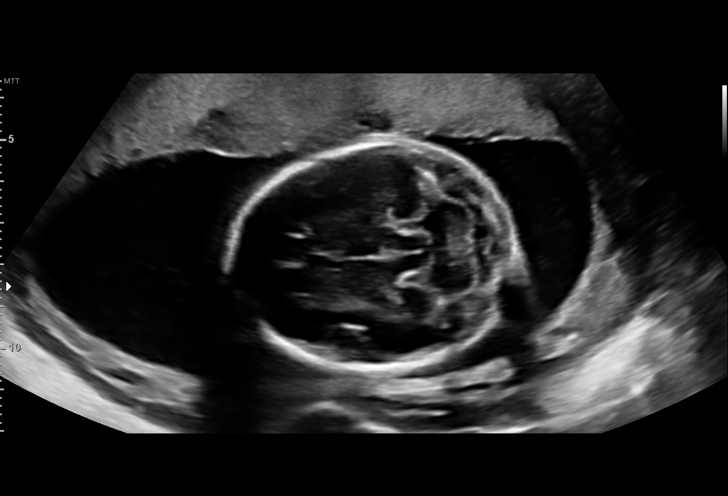
[im 44/75]
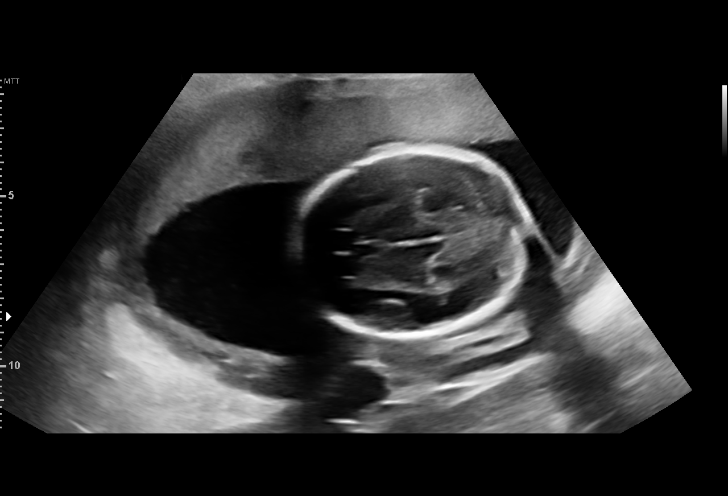
[im 50/75]
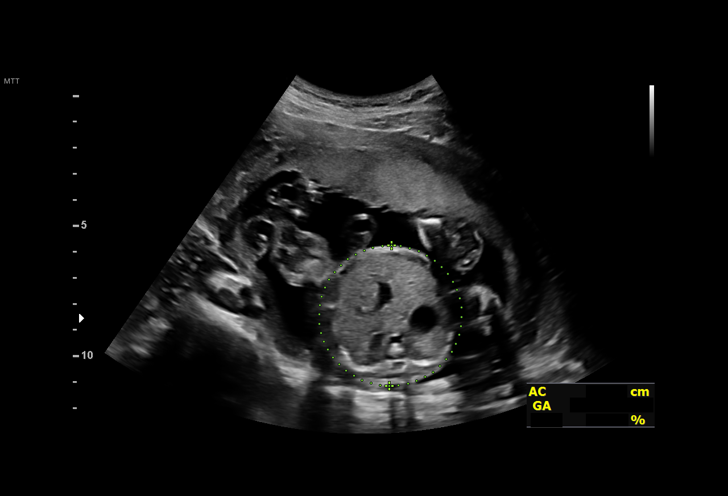
[im 55/75]
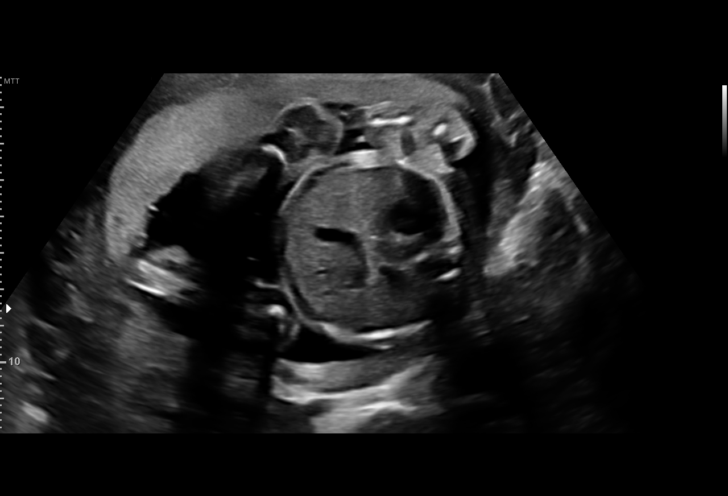
[im 61/75]
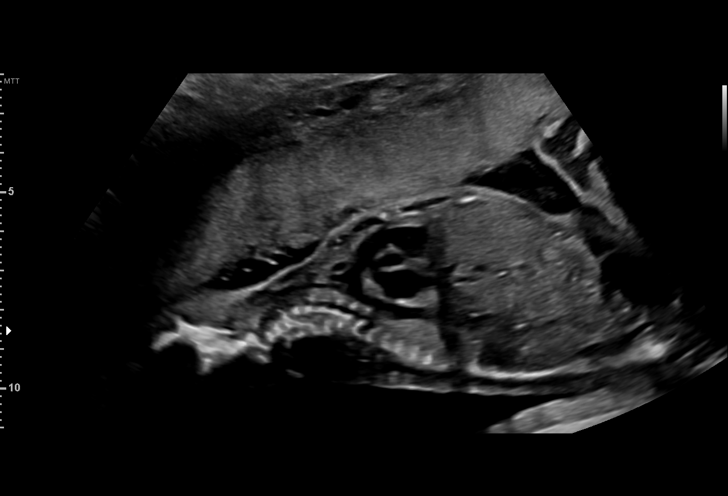
[im 66/75]
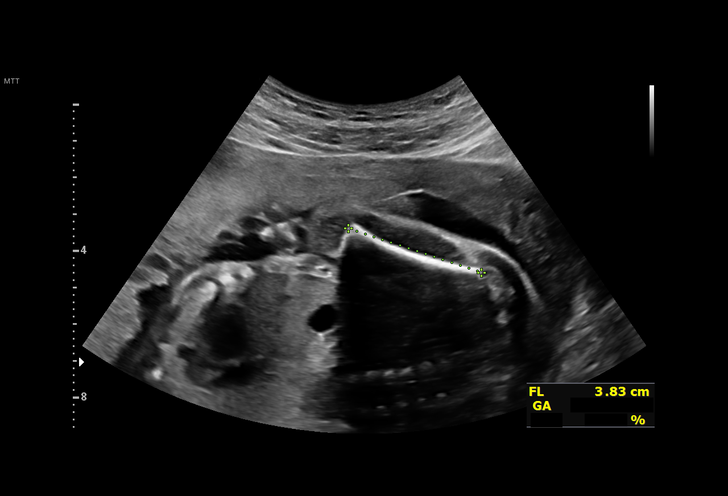
[im 72/75]
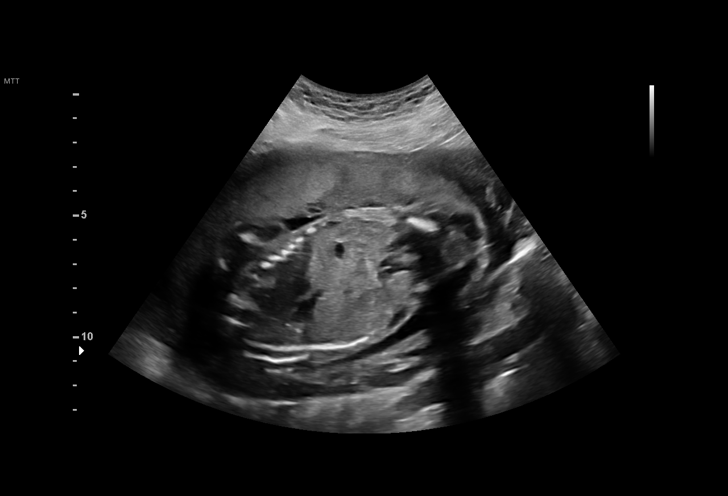

[13 of 28 positions shown; findings below may reference images not displayed]

Obstetrics &
                                                            Gynecology
                                                            [id number]
                                                            Jhon Ryan.
                   CNM

                                                       [place]
 ----------------------------------------------------------------------

 ----------------------------------------------------------------------
Indications

  22 weeks gestation of pregnancy
  Advanced maternal age multigravida 35+,
  third trimester
  Encounter for antenatal screening for
  malformations
  Poor obstetric history: Previous preterm
  delivery, antepartum (36 weeks)
  Medical complication of pregnancy
  (Idiopathic thrombocytopenic Bodlal)
 ----------------------------------------------------------------------
Fetal Evaluation

 Num Of Fetuses:         1
 Fetal Heart Rate(bpm):  150
 Cardiac Activity:       Observed
 Presentation:           Breech
 Placenta:               Anterior
 P. Cord Insertion:      Visualized, central

 Amniotic Fluid
 AFI FV:      Within normal limits

                             Largest Pocket(cm)

Biometry
 BPD:      54.3  mm     G. Age:  22w 4d         33  %    CI:        75.14   %    70 - 86
                                                         FL/HC:      19.2   %    19.2 -
 HC:      198.7  mm     G. Age:  22w 0d         10  %    HC/AC:      1.16        1.05 -
 AC:      171.7  mm     G. Age:  22w 1d         20  %    FL/BPD:     70.2   %    71 - 87
 FL:       38.1  mm     G. Age:  22w 1d         19  %    FL/AC:      22.2   %    20 - 24
 CER:      25.4  mm     G. Age:  23w 3d         58  %
 LV:          5  mm

 Est. FW:     481  gm      1 lb 1 oz     15  %
OB History

 Gravidity:    6         Term:   0        Prem:   1        SAB:   2
 TOP:          2       Ectopic:  0        Living: 1
Gestational Age

 LMP:           24w 0d        Date:  04/01/19                 EDD:   01/06/20
 U/S Today:     22w 2d                                        EDD:   01/18/20
 Best:          22w 6d     Det. By:  Early Ultrasound         EDD:   01/14/20
                                     (06/02/19)
Anatomy

 Cranium:               Appears normal         LVOT:                   Appears normal
 Cavum:                 Appears normal         Aortic Arch:            Appears normal
 Ventricles:            Appears normal         Ductal Arch:            Appears normal
 Choroid Plexus:        Previously seen        Diaphragm:              Appears normal
 Cerebellum:            Appears normal         Stomach:                Appears normal, left
                                                                       sided
 Posterior Fossa:       Previously seen        Abdomen:                Previously seen
 Nuchal Fold:           Previously seen        Abdominal Wall:         Previously seen
 Face:                  Orbits prev. vis;      Cord Vessels:           Previously seen
                        Profile appears nl
 Lips:                  Appears normal         Kidneys:                Appear normal
 Palate:                Not well visualized    Bladder:                Appears normal
 Thoracic:              Appears normal         Spine:                  Previously seen
 Heart:                 Appears normal         Upper Extremities:      Previously seen
                        (4CH, axis, and
                        situs)
 RVOT:                  Appears normal         Lower Extremities:      Previously seen

 Other:  Female gender prev. vis.  Heels visualized prev.
Cervix Uterus Adnexa

 Cervix
 Length:           3.98  cm.
 Normal appearance by transabdominal scan.

 Uterus
 No abnormality visualized.

 Left Ovary
 Within normal limits. No adnexal mass visualized.

 Right Ovary
 Within normal limits. No adnexal mass visualized.
 Cul De Sac
 No free fluid seen.

 Adnexa
 No abnormality visualized.
Impression

 Patient returned for completion of fetal anatomy. Fetal growth
 is appropriate for gestational age. Amniotic fluid is normal
 and good fetal activity is seen. Fetal anatomical survey was
 completed and appears normal.
Recommendations

 Recommend fetal growth assessment at 32 weeks (AMA).
                 Sambas, Jamaludin Nds

## 2021-05-24 ENCOUNTER — Encounter: Payer: Self-pay | Admitting: Advanced Practice Midwife

## 2021-05-24 ENCOUNTER — Ambulatory Visit (INDEPENDENT_AMBULATORY_CARE_PROVIDER_SITE_OTHER): Payer: Medicaid Other | Admitting: Advanced Practice Midwife

## 2021-05-24 ENCOUNTER — Other Ambulatory Visit: Payer: Self-pay

## 2021-05-24 VITALS — BP 120/86 | HR 86 | Wt 193.0 lb

## 2021-05-24 DIAGNOSIS — O021 Missed abortion: Secondary | ICD-10-CM | POA: Diagnosis not present

## 2021-05-24 DIAGNOSIS — O039 Complete or unspecified spontaneous abortion without complication: Secondary | ICD-10-CM | POA: Diagnosis not present

## 2021-05-24 HISTORY — DX: Complete or unspecified spontaneous abortion without complication: O03.9

## 2021-05-24 MED ORDER — VALACYCLOVIR HCL 500 MG PO TABS: 500.0000 mg | ORAL_TABLET | Freq: Every day | ORAL | 1 refills | Status: DC

## 2021-05-24 NOTE — Progress Notes (Signed)
° °  Subjective:    Patient ID: Samantha Swanson, female    DOB: 1980-12-25, 41 y.o.   MRN: [id number] This is a 41 y.o. female who is 3 weeks post missed abortion who presents for followup. She presented to MAU on 05/03/21 with c/o bleeding and cramping.  Was found to have a nonviable 7 week fetus with no heartbeat.  She took Cytotec for this and states did not have as much bleeding as she did with prior SAB.   Has felt some depression at times.  No further pain or bleeding.  Other This is a new problem. The current episode started 1 to 4 weeks ago. The problem has been resolved. Pertinent negatives include no abdominal pain, chest pain, chills, fever or weakness. Associated symptoms comments: Some depression at times post miscarriage .     Review of Systems  Constitutional:  Negative for chills and fever.  Cardiovascular:  Negative for chest pain.  Gastrointestinal:  Negative for abdominal pain.  Neurological:  Negative for weakness.      Objective:   Physical Exam Constitutional:      General: She is not in acute distress.    Appearance: She is not ill-appearing or toxic-appearing.  HENT:     Head: Normocephalic.  Cardiovascular:     Rate and Rhythm: Normal rate.  Pulmonary:     Effort: Pulmonary effort is normal.  Genitourinary:    Comments: Deferred  Musculoskeletal:        General: Normal range of motion.  Skin:    General: Skin is warm and dry.  Neurological:     General: No focal deficit present.     Mental Status: She is alert.  Psychiatric:        Mood and Affect: Mood normal.          Assessment & Plan:  A:  3 week s/p missed abortion with Cytotec induction of SAB       Grief reaction  P:   Will recheck HCG level to rule out retained products of conception       Offered counseling         Patient unsure about contraception for now        Will refer to [facility] for pap test due to lack of insurance after pregnancy medicaid ends        Followup as  needed Aviva Signs, CNM

## 2021-05-24 NOTE — Progress Notes (Signed)
Patient presents for follow up on SAB. Kathrene Alu RN

## 2021-05-25 LAB — BETA HCG QUANT (REF LAB): hCG Quant: 11 m[IU]/mL

## 2021-05-30 ENCOUNTER — Telehealth: Payer: Self-pay

## 2021-05-30 NOTE — Telephone Encounter (Signed)
Attempted to reach patient to have her come in for one more HCG per Wynelle Bourgeois, CNM  Unable to leave message for patient. Armandina Stammer RN

## 2021-06-09 DIAGNOSIS — J060 Acute laryngopharyngitis: Secondary | ICD-10-CM | POA: Diagnosis not present

## 2021-06-09 DIAGNOSIS — J019 Acute sinusitis, unspecified: Secondary | ICD-10-CM | POA: Diagnosis not present

## 2021-06-09 DIAGNOSIS — R5383 Other fatigue: Secondary | ICD-10-CM | POA: Diagnosis not present

## 2021-06-09 DIAGNOSIS — J029 Acute pharyngitis, unspecified: Secondary | ICD-10-CM | POA: Diagnosis not present

## 2021-06-09 DIAGNOSIS — R0981 Nasal congestion: Secondary | ICD-10-CM | POA: Diagnosis not present

## 2021-06-23 ENCOUNTER — Telehealth: Payer: Self-pay

## 2021-06-23 NOTE — Telephone Encounter (Signed)
Pt was called on 06-23-2021  said in valid voice mail  kg

## 2022-01-10 DIAGNOSIS — Z7251 High risk heterosexual behavior: Secondary | ICD-10-CM | POA: Diagnosis not present

## 2022-01-10 DIAGNOSIS — N7689 Other specified inflammation of vagina and vulva: Secondary | ICD-10-CM | POA: Diagnosis not present

## 2022-02-25 DIAGNOSIS — Z202 Contact with and (suspected) exposure to infections with a predominantly sexual mode of transmission: Secondary | ICD-10-CM | POA: Diagnosis not present

## 2022-02-25 DIAGNOSIS — A6004 Herpesviral vulvovaginitis: Secondary | ICD-10-CM | POA: Diagnosis not present

## 2022-02-25 DIAGNOSIS — Z3202 Encounter for pregnancy test, result negative: Secondary | ICD-10-CM | POA: Diagnosis not present

## 2022-02-25 DIAGNOSIS — N7689 Other specified inflammation of vagina and vulva: Secondary | ICD-10-CM | POA: Diagnosis not present

## 2022-03-24 DIAGNOSIS — Z202 Contact with and (suspected) exposure to infections with a predominantly sexual mode of transmission: Secondary | ICD-10-CM | POA: Diagnosis not present

## 2022-03-24 DIAGNOSIS — N760 Acute vaginitis: Secondary | ICD-10-CM | POA: Diagnosis not present

## 2022-03-24 DIAGNOSIS — Z3202 Encounter for pregnancy test, result negative: Secondary | ICD-10-CM | POA: Diagnosis not present

## 2022-03-24 DIAGNOSIS — A6004 Herpesviral vulvovaginitis: Secondary | ICD-10-CM | POA: Diagnosis not present

## 2022-06-21 DIAGNOSIS — N390 Urinary tract infection, site not specified: Secondary | ICD-10-CM | POA: Diagnosis not present

## 2022-06-21 DIAGNOSIS — A6004 Herpesviral vulvovaginitis: Secondary | ICD-10-CM | POA: Diagnosis not present

## 2022-07-25 DIAGNOSIS — R350 Frequency of micturition: Secondary | ICD-10-CM | POA: Diagnosis not present

## 2022-07-25 DIAGNOSIS — N390 Urinary tract infection, site not specified: Secondary | ICD-10-CM | POA: Diagnosis not present

## 2022-07-25 DIAGNOSIS — A6004 Herpesviral vulvovaginitis: Secondary | ICD-10-CM | POA: Diagnosis not present

## 2022-07-25 DIAGNOSIS — R319 Hematuria, unspecified: Secondary | ICD-10-CM | POA: Diagnosis not present

## 2023-05-03 DIAGNOSIS — R0789 Other chest pain: Secondary | ICD-10-CM | POA: Diagnosis not present

## 2023-05-03 DIAGNOSIS — M62838 Other muscle spasm: Secondary | ICD-10-CM | POA: Diagnosis not present

## 2023-05-03 DIAGNOSIS — R519 Headache, unspecified: Secondary | ICD-10-CM | POA: Diagnosis not present

## 2023-05-03 DIAGNOSIS — R110 Nausea: Secondary | ICD-10-CM | POA: Diagnosis not present

## 2023-11-13 DIAGNOSIS — N898 Other specified noninflammatory disorders of vagina: Secondary | ICD-10-CM | POA: Diagnosis not present

## 2023-12-04 ENCOUNTER — Other Ambulatory Visit (HOSPITAL_COMMUNITY)
Admission: RE | Admit: 2023-12-04 | Discharge: 2023-12-04 | Disposition: A | Discharge status: Home / Self Care | Source: Ambulatory Visit | Attending: Advanced Practice Midwife | Admitting: Advanced Practice Midwife

## 2023-12-04 ENCOUNTER — Ambulatory Visit: Admitting: Advanced Practice Midwife

## 2023-12-04 ENCOUNTER — Encounter: Payer: Self-pay | Admitting: Advanced Practice Midwife

## 2023-12-04 VITALS — BP 122/87 | HR 70 | Ht 64.0 in | Wt 179.0 lb

## 2023-12-04 DIAGNOSIS — Z8659 Personal history of other mental and behavioral disorders: Secondary | ICD-10-CM | POA: Diagnosis not present

## 2023-12-04 DIAGNOSIS — Z8619 Personal history of other infectious and parasitic diseases: Secondary | ICD-10-CM

## 2023-12-04 DIAGNOSIS — Z1231 Encounter for screening mammogram for malignant neoplasm of breast: Secondary | ICD-10-CM

## 2023-12-04 DIAGNOSIS — Z113 Encounter for screening for infections with a predominantly sexual mode of transmission: Secondary | ICD-10-CM | POA: Diagnosis not present

## 2023-12-04 DIAGNOSIS — Z1331 Encounter for screening for depression: Secondary | ICD-10-CM

## 2023-12-04 DIAGNOSIS — Z124 Encounter for screening for malignant neoplasm of cervix: Secondary | ICD-10-CM | POA: Insufficient documentation

## 2023-12-04 DIAGNOSIS — G8929 Other chronic pain: Secondary | ICD-10-CM | POA: Diagnosis not present

## 2023-12-04 DIAGNOSIS — Z1589 Genetic susceptibility to other disease: Secondary | ICD-10-CM | POA: Diagnosis not present

## 2023-12-04 DIAGNOSIS — Z01419 Encounter for gynecological examination (general) (routine) without abnormal findings: Secondary | ICD-10-CM | POA: Diagnosis not present

## 2023-12-04 DIAGNOSIS — Z8041 Family history of malignant neoplasm of ovary: Secondary | ICD-10-CM

## 2023-12-04 DIAGNOSIS — R102 Pelvic and perineal pain: Secondary | ICD-10-CM

## 2023-12-04 MED ORDER — VALACYCLOVIR HCL 1 G PO TABS: 1000.0000 mg | ORAL_TABLET | Freq: Every day | ORAL | 2 refills | Status: DC
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
## 2023-12-04 NOTE — Progress Notes (Signed)
 Pt presents for AEX  Last PAP approx. 10 years ago  Requesting STD testing   Requesting Rx for Valtrex . Reports outbreaks 2x per year

## 2023-12-05 ENCOUNTER — Other Ambulatory Visit: Payer: Self-pay | Admitting: Advanced Practice Midwife

## 2023-12-05 DIAGNOSIS — Z8619 Personal history of other infectious and parasitic diseases: Secondary | ICD-10-CM

## 2023-12-05 LAB — HEPATITIS C ANTIBODY: Hep C Virus Ab: NONREACTIVE

## 2023-12-05 LAB — CERVICOVAGINAL ANCILLARY ONLY
Bacterial Vaginitis (gardnerella): NEGATIVE
Candida Glabrata: NEGATIVE
Candida Vaginitis: NEGATIVE
Chlamydia: NEGATIVE
Comment: NEGATIVE
Comment: NEGATIVE
Comment: NEGATIVE
Comment: NEGATIVE
Comment: NEGATIVE
Comment: NORMAL
Neisseria Gonorrhea: NEGATIVE
Trichomonas: NEGATIVE

## 2023-12-05 LAB — HEPATITIS B SURFACE ANTIGEN: Hepatitis B Surface Ag: NEGATIVE

## 2023-12-05 LAB — RPR: RPR Ser Ql: NONREACTIVE

## 2023-12-05 LAB — HIV ANTIBODY (ROUTINE TESTING W REFLEX): HIV Screen 4th Generation wRfx: NONREACTIVE

## 2023-12-05 MED ORDER — VALACYCLOVIR HCL 500 MG PO TABS: 500.0000 mg | ORAL_TABLET | Freq: Every day | ORAL | 4 refills | Status: AC

## 2023-12-07 ENCOUNTER — Ambulatory Visit: Payer: Self-pay | Admitting: Advanced Practice Midwife

## 2023-12-10 LAB — CYTOLOGY - PAP
Adequacy: ABSENT
Comment: NEGATIVE
Diagnosis: NEGATIVE
High risk HPV: NEGATIVE

## 2023-12-11 ENCOUNTER — Ambulatory Visit (HOSPITAL_COMMUNITY)
Admission: RE | Admit: 2023-12-11 | Discharge: 2023-12-11 | Disposition: A | Discharge status: Home / Self Care | Source: Ambulatory Visit | Attending: Advanced Practice Midwife | Admitting: Advanced Practice Midwife

## 2023-12-11 DIAGNOSIS — G8929 Other chronic pain: Secondary | ICD-10-CM | POA: Diagnosis not present

## 2023-12-11 DIAGNOSIS — R102 Pelvic and perineal pain: Secondary | ICD-10-CM | POA: Diagnosis not present

## 2023-12-17 LAB — EMPOWER MULTI-CANCER (2 + 38): REPORT SUMMARY: POSITIVE — AB

## 2023-12-17 NOTE — Addendum Note (Signed)
 Addended by: MILLY PLANAS A on: 12/17/2023 01:59 PM   Modules accepted: Orders

## 2023-12-21 ENCOUNTER — Telehealth: Payer: Self-pay

## 2023-12-21 NOTE — Telephone Encounter (Signed)
 Spoke with Samantha Swanson regarding her referral to GYN oncology. She has an appointment scheduled with Dr. Eldonna on 8/11 at 10:30. Patient agrees to date and time. She has been provided with office address and location. She is also aware of our mask and visitor policy. Patient verbalized understanding and will call with any questions.

## 2023-12-25 ENCOUNTER — Ambulatory Visit: Admitting: Internal Medicine

## 2023-12-25 ENCOUNTER — Encounter: Payer: Self-pay | Admitting: Internal Medicine

## 2023-12-25 ENCOUNTER — Telehealth: Payer: Self-pay | Admitting: *Deleted

## 2023-12-25 VITALS — BP 110/72 | HR 73 | Temp 98.1°F | Ht 64.0 in | Wt 186.2 lb

## 2023-12-25 DIAGNOSIS — N898 Other specified noninflammatory disorders of vagina: Secondary | ICD-10-CM | POA: Diagnosis not present

## 2023-12-25 DIAGNOSIS — Z1589 Genetic susceptibility to other disease: Secondary | ICD-10-CM

## 2023-12-25 DIAGNOSIS — B009 Herpesviral infection, unspecified: Secondary | ICD-10-CM | POA: Diagnosis not present

## 2023-12-25 DIAGNOSIS — F411 Generalized anxiety disorder: Secondary | ICD-10-CM

## 2023-12-25 DIAGNOSIS — D693 Immune thrombocytopenic purpura: Secondary | ICD-10-CM | POA: Diagnosis not present

## 2023-12-25 MED ORDER — BUSPIRONE HCL 10 MG PO TABS: 10.0000 mg | ORAL_TABLET | Freq: Three times a day (TID) | ORAL | 1 refills | Status: AC | PRN

## 2023-12-25 NOTE — Telephone Encounter (Signed)
 Spoke with Ms. Samantha Swanson regarding her referral to GYN oncology. Pt called to reschedule her appointment with Dr. Eldonna from August 11 th to August 18th. She has an appointment scheduled with Dr. Eldonna on 08/18 at 1030. Patient agrees to date and time. She has been provided with office address and location. She is also aware of our mask and visitor policy. Patient verbalized understanding and will call with any questions.

## 2023-12-25 NOTE — Patient Instructions (Addendum)
 Vaginal odor:  Ph-D (brand name) boric acid vaginal suppository. Use once daily.      Generalized Anxiety :  Buspar  10mg  three times a day as needed  Ashwaganda = natural supplement as needed

## 2023-12-25 NOTE — Progress Notes (Signed)
 [facility] [address] [place] [address] Dept: [phone] Dept Fax: [phone]  New Patient Office Visit  Subjective:   Samantha Swanson [birth year] 12/25/2023  Chief Complaint  Patient presents with   Establish Care    Discuss labs  Faint pain on left side     HPI: Samantha Swanson presents today to establish care at [facility]. Introduced to Publishing rights manager role and practice setting.  All questions answered.  Concerns: See below   Discussed the use of AI scribe software for clinical note transcription with the patient, who gave verbal consent to proceed.  History of Present Illness   Samantha Swanson is a 43 year old female who presents to establish care as a new patient.  She has a long-standing history of migraine headaches for approximately 20 years. Previously, she received injections at urgent care facilities for acute management, but there is no mention of current treatment or frequency of episodes.  She has a history of herpes simplex virus and takes Valtrex  daily as a preventative measure. She experiences outbreaks approximately twice a year and takes a higher dose of 1000 mg at the onset of an outbreak.  Her family history is significant for cancer, including an aunt who passed away from endometrial cancer, a father who recently completed radiation for prostate cancer, and a grandfather who died from pancreatic cancer. 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 Negative for BRCA1 or BRCA2 mutations.   She has a history of idiopathic thrombocytopenic purpura (ITP).   She experiences vaginal odor, which she associates with dietary factors, particularly after consuming fish. She is sexually active with 1 female partner. Recently saw OBGYN for CC, negative for STD's, BV, yeast.   She experiences high levels of anxiety, which she attributes to her family history and current life  stressors, including raising a toddler and working in [employer]. She has not tried any medications for anxiety but is open to non-sedating options.        12/25/2023    2:32 PM 12/04/2023    3:42 PM  GAD 7 : Generalized Anxiety Score  Nervous, Anxious, on Edge 1 3  Control/stop worrying 1 2  Worry too much - different things 1 2  Trouble relaxing 1 2  Restless 1 1  Easily annoyed or irritable 1 1  Afraid - awful might happen 0 1  Total GAD 7 Score 6 12  Anxiety Difficulty Somewhat difficult        12/25/2023    2:32 PM 12/04/2023    3:42 PM  Depression screen PHQ 2/9  Decreased Interest 0 0  Down, Depressed, Hopeless 0 0  PHQ - 2 Score 0 0  Altered sleeping 1 2  Tired, decreased energy 1 0  Change in appetite 0 1  Feeling bad or failure about yourself  0 0  Trouble concentrating 2 3  Moving slowly or fidgety/restless 0 0  Suicidal thoughts 0 0  PHQ-9 Score 4 6  Difficult doing work/chores Not difficult at all       The following portions of the patient's history were reviewed and updated as appropriate: past medical history, past surgical history, family history, social history, allergies, medications, and problem list.   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   Generalized anxiety disorder 12/25/2023   Vaginal odor 12/25/2023   HSV (herpes simplex virus) infection 01/07/2020   Anxiety 11/28/2010  Depression    Idiopathic thrombocytopenic purpura (HCC)    Migraines    Genital herpes    Past Medical History:  Diagnosis Date   Abnormal Pap smear    Allergy    Chronic headaches    Depression    not on meds, ok now   Family history of adverse reaction to anesthesia    mother of patient does not do well with the gas   Genital herpes    Heart murmur    no work up   HSV infection    ITP (idiopathic thrombocytopenic purpura)    Migraines    Ovarian cyst    SAB (spontaneous abortion) 05/24/2021   Sciatica of right  side 11/28/2010   Thrombocytopenia (HCC)    UTI (lower urinary tract infection)    Vaginal Pap smear, abnormal    no repeat   Past Surgical History:  Procedure Laterality Date   INDUCED ABORTION     x2   Family History  Problem Relation Age of Onset   Heart disease Mother    Hypertension Mother    Diabetes Mother    Osteoporosis Mother    Hypertension Father    Prostate cancer Father    Cancer Father    Mental illness Other    Hypertension Other    Stroke Other    Mental illness Other    Cancer Other        colon cance and lung cancer   Heart disease Other    Mental illness Other    Anesthesia problems Neg Hx    Other Neg Hx     Current Outpatient Medications:    busPIRone  (BUSPAR ) 10 MG tablet, Take 1 tablet (10 mg total) by mouth 3 (three) times daily as needed (anxiety or panic attacks)., Disp: 180 tablet, Rfl: 1   valACYclovir  (VALTREX ) 500 MG tablet, Take 1 tablet (500 mg total) by mouth daily., Disp: 90 tablet, Rfl: 4 Allergies  Allergen Reactions   Latex Itching    burning    ROS: A complete ROS was performed with pertinent positives/negatives noted in the HPI. The remainder of the ROS are negative.   Objective:   Today's Vitals   12/25/23 1345  BP: 110/72  Pulse: 73  Temp: 98.1 F (36.7 C)  TempSrc: Temporal  SpO2: 100%  Weight: 186 lb 3.2 oz (84.5 kg)  Height: 5' 4 (1.626 m)    GENERAL: Well-appearing, in NAD. Well nourished.  SKIN: Pink, warm and dry. No rash, lesion, ulceration, or ecchymoses.  NECK: Trachea midline. Full ROM w/o pain or tenderness. No lymphadenopathy.  RESPIRATORY: Chest wall symmetrical. Respirations even and non-labored. Breath sounds clear to auscultation bilaterally.  CARDIAC: S1, S2 present, regular rate and rhythm. Peripheral pulses 2+ bilaterally.  EXTREMITIES: Without clubbing, cyanosis, or edema.  NEUROLOGIC: No motor or sensory deficits. Steady, even gait.  PSYCH/MENTAL STATUS: Alert, oriented x 3. Cooperative,  appropriate mood and affect.   There are no preventive care reminders to display for this patient.   No results found for any visits on 12/25/23.  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 - Continue referral to [facility] for risk assessment and management. - Maintain scheduled mammogram at the end of the month.  Herpes simplex virus infection Herpes simplex virus infection with biannual outbreaks. - Continue Valtrex  for outbreak prevention and treatment.  Immune thrombocytopenic purpura (ITP) ITP  with previously low platelet counts, currently asymptomatic. - Monitor blood counts during routine physicals.  Generalized anxiety disorder High anxiety levels exacerbated by life stressors and family history. No prior pharmacological treatment. Open to non-sedating medication. - Prescribe Buspar  10mg  TID PRN for anxiety. - Consider ashwagandha as a natural supplement for anxiety management. - Follow up in six weeks to assess anxiety management and adjust treatment as necessary.  Vaginal Odor - Advise dietary modifications to reduce odor, particularly avoiding fish. - Increase water intake. - Recommend over-the-counter boric acid vaginal suppository (PHD brand) once daily.     Meds ordered this encounter  Medications   busPIRone  (BUSPAR ) 10 MG tablet    Sig: Take 1 tablet (10 mg total) by mouth 3 (three) times daily as needed (anxiety or panic attacks).    Dispense:  180 tablet    Refill:  1    Supervising Provider:   SEBASTIAN BEVERLEY NOVAK [[id number]]    Return in about 6 weeks (around 02/05/2024) for anxiety .   Rosina Senters, FNP

## 2023-12-31 ENCOUNTER — Inpatient Hospital Stay: Admitting: Psychiatry

## 2024-01-02 ENCOUNTER — Encounter: Payer: Self-pay | Admitting: Psychiatry

## 2024-01-02 ENCOUNTER — Ambulatory Visit

## 2024-01-04 ENCOUNTER — Ambulatory Visit
Admission: RE | Admit: 2024-01-04 | Discharge: 2024-01-04 | Disposition: A | Discharge status: Home / Self Care | Source: Ambulatory Visit | Attending: Advanced Practice Midwife | Admitting: Advanced Practice Midwife

## 2024-01-04 ENCOUNTER — Other Ambulatory Visit: Payer: Self-pay | Admitting: Advanced Practice Midwife

## 2024-01-04 DIAGNOSIS — N644 Mastodynia: Secondary | ICD-10-CM

## 2024-01-04 DIAGNOSIS — Z1231 Encounter for screening mammogram for malignant neoplasm of breast: Secondary | ICD-10-CM

## 2024-01-07 ENCOUNTER — Inpatient Hospital Stay: Attending: Psychiatry | Admitting: Psychiatry

## 2024-01-07 ENCOUNTER — Encounter: Payer: Self-pay | Admitting: Psychiatry

## 2024-01-07 VITALS — BP 112/64 | HR 63 | Temp 98.1°F | Resp 20 | Ht 64.0 in | Wt 182.2 lb

## 2024-01-07 DIAGNOSIS — Z1501 Genetic susceptibility to malignant neoplasm of breast: Secondary | ICD-10-CM | POA: Diagnosis not present

## 2024-01-07 DIAGNOSIS — Z1509 Genetic susceptibility to other malignant neoplasm: Secondary | ICD-10-CM | POA: Insufficient documentation

## 2024-01-07 DIAGNOSIS — Z1379 Encounter for other screening for genetic and chromosomal anomalies: Secondary | ICD-10-CM | POA: Diagnosis not present

## 2024-01-07 DIAGNOSIS — F1290 Cannabis use, unspecified, uncomplicated: Secondary | ICD-10-CM | POA: Diagnosis not present

## 2024-01-07 DIAGNOSIS — Z87891 Personal history of nicotine dependence: Secondary | ICD-10-CM | POA: Insufficient documentation

## 2024-01-07 DIAGNOSIS — Z800 Family history of malignant neoplasm of digestive organs: Secondary | ICD-10-CM | POA: Insufficient documentation

## 2024-01-07 DIAGNOSIS — R011 Cardiac murmur, unspecified: Secondary | ICD-10-CM | POA: Insufficient documentation

## 2024-01-07 DIAGNOSIS — Z1589 Genetic susceptibility to other disease: Secondary | ICD-10-CM | POA: Diagnosis not present

## 2024-01-07 DIAGNOSIS — Z148 Genetic carrier of other disease: Secondary | ICD-10-CM | POA: Insufficient documentation

## 2024-01-07 DIAGNOSIS — D693 Immune thrombocytopenic purpura: Secondary | ICD-10-CM | POA: Insufficient documentation

## 2024-01-07 DIAGNOSIS — Z8049 Family history of malignant neoplasm of other genital organs: Secondary | ICD-10-CM | POA: Insufficient documentation

## 2024-01-07 DIAGNOSIS — A6000 Herpesviral infection of urogenital system, unspecified: Secondary | ICD-10-CM | POA: Insufficient documentation

## 2024-01-07 DIAGNOSIS — R519 Headache, unspecified: Secondary | ICD-10-CM | POA: Insufficient documentation

## 2024-01-07 DIAGNOSIS — Z8042 Family history of malignant neoplasm of prostate: Secondary | ICD-10-CM | POA: Diagnosis not present

## 2024-01-07 NOTE — Progress Notes (Signed)
 GYNECOLOGIC ONCOLOGY NEW PATIENT CONSULTATION  Date of Service: 01/07/2024 Referring Provider: Milly Olam LABOR, CNM [address] [place],  [place] [address]   ASSESSMENT AND PLAN: Samantha Swanson is a 43 y.o. 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  The risk of ovarian cancer in patients with PALB2 mutations is approximately 3-5%.  In addition, patients with PALB2 mutations are also at increased risk of breast cancer and an increased risk of pancreatic cancer (2-5%).  The [employer] ([employer]CCN) recommends consideration of removal of bilateral ovaries and fallopian tubes starting at age 68-50, to reduce the risk of ovarian and fallopian tube cancer.   Preventive surgery to remove the ovaries and fallopian tubes reduces the cancer but does not completely eliminate the risk of developing a cancer of the peritoneum.    Although long-term oral contraceptive use is suggested to reduce the risk of ovarian cancer among women who carry a mutation, data on the effect of oral contraceptives on breast cancer risk are inconsistent.  We also discussed the risks and benefits of possible hysterectomy at time of risk reducing BSO. We discussed the use of HRT in the setting of a uterus or no uterus. Individuals who undergo hysterectomy at the time of RRSO are candidates for estrogen-alone HRT, which is associated with a decreased risk of breast cancer compared to combined estrogen and progesterone, which is required when the uterus is left in situ.  At this time, given her age and no family history of ovarian cancer, feel that it is reasonable to wait until age 53-50 for risk reducing surgery. In the meantime recommend continued annual pelvic exam with her Ob/Gyn clinic.  Does not need to undergo scheduled pelvic ultrasound or CA125 evaluation.  Pelvic ultrasound can be performed if change in symptom or exam.  Can return to our clinic when ready for risk reducing surgery.  Referral  also placed to genetic counselors.  In particular she has 2 daughters and may benefit from discussions on how to discuss with them and when they should consider testing for themselves.   A copy of this note was sent to the patient's referring provider.  Hoy Masters, MD Gynecologic Oncology   Medical Decision Making I personally spent  TOTAL 45 minutes face-to-face and non-face-to-face in the care of this patient, which includes all pre, intra, and post visit time on the date of service.   ------------  CC: PALB2 mutation  HISTORY OF PRESENT ILLNESS:  Samantha Swanson is a 43 y.o. woman who is seen in consultation at the request of [place]-Kir[place], Olam LABOR,* for evaluation of PALB2 mutation.  Patient was seen by her provider in 12/04/2023 for an annual exam.  At that time she noted a family history of ovarian cancer in her maternal aunt.  Given her family history she was offered genetic testing.  This returned with a mutation in PALB2.  Today patient presents with her mother.  They clarified that there is no ovarian cancer history in the family and instead her paternal aunt had a history of endometrial cancer in her 66s and is now deceased from this cancer.  The patient's mother reports that she went to many of these visits with the aunt and so is certain that the diagnosis was endometrial cancer.  She otherwise reports that she has not been seen by genetics and does not have an appointment with them yet.  She reports early satiety over the past 4 months but otherwise denies current pelvic  pain, abdominal bloating, significant weight loss, change in bowel or bladder habits.   She underwent a pelvic ultrasound on 12/11/2023 which was normal.   PAST MEDICAL HISTORY: Past Medical History:  Diagnosis Date   Abnormal Pap smear    Allergy    Chronic headaches    Depression    not on meds, ok now   Family history of adverse reaction to anesthesia    mother of patient does not do well  with the gas   Genital herpes    Heart murmur    no work up   HSV infection    ITP (idiopathic thrombocytopenic purpura)    Migraines    Ovarian cyst    SAB (spontaneous abortion) 05/24/2021   Sciatica of right side 11/28/2010   Thrombocytopenia (HCC)    UTI (lower urinary tract infection)    Vaginal Pap smear, abnormal    no repeat    PAST SURGICAL HISTORY: Past Surgical History:  Procedure Laterality Date   INDUCED ABORTION     x2    OB/GYN HISTORY: OB History  Gravida Para Term Preterm AB Living  7 2 1 1 5 2   SAB IAB Ectopic Multiple Live Births  3 2 0 0 2    # Outcome Date GA Lbr Len/2nd Weight Sex Type Anes PTL Lv  7 Term 01/07/20 [redacted]w[redacted]d 19:34 / 00:23 7 lb 4.9 oz (3.314 kg) F Vag-Spont EPI  LIV  6 Preterm 2004 [redacted]w[redacted]d   F Vag-Spont EPI Y LIV  5 SAB           4 SAB           3 IAB           2 IAB           1 SAB               Age at menarche: 58 Age at menopause: na Hx of HRT: no Hx of STI: HSV Last pap: 12/04/23 NILM, HPV HR neg History of abnormal pap smears: reports 2 in her life around early 20s, no procedures  SCREENING STUDIES:  Last mammogram: not yet, rescheduled Last colonoscopy: none  MEDICATIONS:  Current Outpatient Medications:    Ascorbic Acid (VITAMIN C PO), Take 1 tablet by mouth daily., Disp: , Rfl:    Cholecalciferol (VITAMIN D-3 PO), Take 1 tablet by mouth daily., Disp: , Rfl:    Cyanocobalamin (VITAMIN B-12 PO), Take 1 tablet by mouth daily., Disp: , Rfl:    valACYclovir  (VALTREX ) 500 MG tablet, Take 1 tablet (500 mg total) by mouth daily., Disp: 90 tablet, Rfl: 4   busPIRone  (BUSPAR ) 10 MG tablet, Take 1 tablet (10 mg total) by mouth 3 (three) times daily as needed (anxiety or panic attacks). (Patient not taking: Reported on 01/02/2024), Disp: 180 tablet, Rfl: 1  ALLERGIES: Allergies  Allergen Reactions   Latex Itching    burning    FAMILY HISTORY: Family History  Problem Relation Age of Onset   Heart disease Mother     Hypertension Mother    Diabetes Mother    Osteoporosis Mother    Hypertension Father    Prostate cancer Father    Lung cancer Maternal Grandmother    Pancreatic cancer Paternal Grandmother        not sure if pancreatic or colon cancer   Colon cancer Paternal Grandfather    Endometrial cancer Paternal Aunt 79   Mental illness Other    Hypertension Other  Stroke Other    Mental illness Other    Heart disease Other    Mental illness Other    Anesthesia problems Neg Hx    Other Neg Hx    Breast cancer Neg Hx    Ovarian cancer Neg Hx     SOCIAL HISTORY: Social History   Socioeconomic History   Marital status: Single    Spouse name: Not on file   Number of children: Not on file   Years of education: 16   Highest education level: Not on file  Occupational History   Occupation: IT consultant  Tobacco Use   Smoking status: Former    Current packs/day: 0.50    Average packs/day: 0.5 packs/day for 10.0 years (5.0 ttl pk-yrs)    Types: Cigarettes   Smokeless tobacco: Never   Tobacco comments:    2018  Vaping Use   Vaping status: Never Used  Substance and Sexual Activity   Alcohol use: Not Currently   Drug use: Yes    Frequency: 1.0 times per week    Types: Marijuana   Sexual activity: Yes    Birth control/protection: None  Other Topics Concern   Not on file  Social History Narrative   Not on file   Social Drivers of Health   Financial Resource Strain: Not on file  Food Insecurity: No Food Insecurity (01/02/2024)   Hunger Vital Sign    Worried About Running Out of Food in the Last Year: Never true    Ran Out of Food in the Last Year: Never true  Transportation Needs: No Transportation Needs (01/02/2024)   PRAPARE - Administrator, Civil Service (Medical): No    Lack of Transportation (Non-Medical): No  Physical Activity: Not on file  Stress: Not on file  Social Connections: Not on file  Intimate Partner Violence: Not At Risk (01/02/2024)    Humiliation, Afraid, Rape, and Kick questionnaire    Fear of Current or Ex-Partner: No    Emotionally Abused: No    Physically Abused: No    Sexually Abused: No    REVIEW OF SYSTEMS: New patient intake form was reviewed.  Complete 10-system review is negative except for the following: Appetite changes, palpitations, pelvic pain, anxiety  PHYSICAL EXAM: BP (!) 89/43 (BP Location: Left Arm, Patient Position: Sitting)   Pulse 63   Temp 98.1 F (36.7 C) (Oral)   Resp 20   Ht 5' 4 (1.626 m)   Wt 182 lb 3.2 oz (82.6 kg)   LMP 12/12/2023   SpO2 100%   BMI 31.27 kg/m  Constitutional: No acute distress. Neuro/Psych: Alert, oriented.  Head and Neck: Normocephalic, atraumatic. Neck symmetric without masses. Sclera anicteric.  Respiratory: Normal work of breathing. Clear to auscultation bilaterally. Cardiovascular: Regular rate and rhythm, no murmurs, rubs, or gallops. Abdomen: Normoactive bowel sounds. Soft, non-distended, non-tender to palpation. No masses appreciated.  Extremities: Grossly normal range of motion. Warm, well perfused. No edema bilaterally. Skin: No rashes or lesions. Lymphatic: No cervical, supraclavicular, or inguinal adenopathy. Genitourinary: External genitalia without lesions. Urethral meatus without lesions or prolapse. On speculum exam, vagina and cervix without lesions. Menstrual blood present. Bimanual exam reveals normal cervix and anterverted mobile uterus. No adnexal mass.  Exam chaperoned by Eleanor Epps, NP   LABORATORY AND RADIOLOGIC DATA: Outside medical records were reviewed to synthesize the above history, along with the history and physical obtained during the visit.  Outside laboratory, pathology, and imaging reports were reviewed, with pertinent results below.  I personally reviewed the outside images.  WBC  Date Value Ref Range Status  04/18/2021 5.0 4.0 - 10.5 K/uL Final   Hemoglobin  Date Value Ref Range Status  04/18/2021 12.8 12.0 - 15.0  g/dL Final   HGB  Date Value Ref Range Status  09/08/2010 13.3 11.6 - 15.9 g/dL Final   HCT  Date Value Ref Range Status  04/18/2021 38.3 36.0 - 46.0 % Final  09/08/2010 38.6 34.8 - 46.6 % Final   Platelets  Date Value Ref Range Status  04/18/2021 153 150 - 400 K/uL Final  09/08/2010 102 (L) 145 - 400 10e3/uL Final   Creatinine, Ser  Date Value Ref Range Status  03/03/2019 1.09 (H) 0.44 - 1.00 mg/dL Final   AST  Date Value Ref Range Status  12/24/2012 15 0 - 37 U/L Final   ALT  Date Value Ref Range Status  12/24/2012 9 0 - 35 U/L Final   Diagnosis  Date Value Ref Range Status  12/04/2023   Final   - Negative for intraepithelial lesion or malignancy (NILM)     US  PELVIC COMPLETE WITH TRANSVAGINAL 12/11/2023  Narrative CLINICAL DATA:  Pelvic pain.  EXAM: TRANSABDOMINAL AND TRANSVAGINAL ULTRASOUND OF PELVIS  TECHNIQUE: Both transabdominal and transvaginal ultrasound examinations of the pelvis were performed. Transabdominal technique was performed for global imaging of the pelvis including uterus, ovaries, adnexal regions, and pelvic cul-de-sac. It was necessary to proceed with endovaginal exam following the transabdominal exam to visualize the bilateral ovaries.  COMPARISON:  May 03, 2021  FINDINGS: Uterus  Measurements: 8.2 cm x 3.1 cm x 4.2 cm = volume: 56.75 mL. No fibroids or other mass visualized.  Endometrium  Thickness: 2.6 mm.  No focal abnormality visualized.  Right ovary  Measurements: 3.7 cm x 2.1 cm x 1.9 cm = volume: 7.57 mL. Normal appearance/no adnexal mass.  Left ovary  Measurements: 2.8 cm x 1.6 cm x 2.8 cm = volume: 6.81 mL. Normal appearance/no adnexal mass.  Other findings  A trace amount of pelvic free fluid is seen, likely physiologic.  IMPRESSION: Unremarkable pelvic ultrasound.   Electronically Signed By: Suzen Dials M.D. On: 12/16/2023 22:47

## 2024-01-07 NOTE — Patient Instructions (Signed)
 It was a pleasure to see you in clinic today. - Recommend you continue annual pelvic exams - Recommend considering removing your tubes and ovaries by age 41-50 - Return to our clinic at age 29-50 when you are ready for surgery.  Thank you very much for allowing me to provide care for you today.  I appreciate your confidence in choosing our Gynecologic Oncology team at [facility].  If you have any questions about your visit today please call our office or send us  a MyChart message and we will get back to you as soon as possible.

## 2024-01-09 ENCOUNTER — Inpatient Hospital Stay
Admission: RE | Admit: 2024-01-09 | Discharge: 2024-01-09 | Discharge status: Home / Self Care | Source: Ambulatory Visit | Attending: Advanced Practice Midwife | Admitting: Advanced Practice Midwife

## 2024-01-09 ENCOUNTER — Ambulatory Visit
Admission: RE | Admit: 2024-01-09 | Discharge: 2024-01-09 | Disposition: A | Discharge status: Home / Self Care | Source: Ambulatory Visit | Attending: Advanced Practice Midwife | Admitting: Advanced Practice Midwife

## 2024-01-09 DIAGNOSIS — N644 Mastodynia: Secondary | ICD-10-CM | POA: Diagnosis not present

## 2024-01-09 DIAGNOSIS — R928 Other abnormal and inconclusive findings on diagnostic imaging of breast: Secondary | ICD-10-CM | POA: Diagnosis not present

## 2024-01-14 ENCOUNTER — Encounter: Payer: Self-pay | Admitting: Genetic Counselor

## 2024-01-14 DIAGNOSIS — Z1379 Encounter for other screening for genetic and chromosomal anomalies: Secondary | ICD-10-CM | POA: Insufficient documentation

## 2024-01-15 ENCOUNTER — Inpatient Hospital Stay: Admitting: Genetic Counselor

## 2024-01-15 ENCOUNTER — Encounter: Payer: Self-pay | Admitting: Genetic Counselor

## 2024-01-15 ENCOUNTER — Ambulatory Visit

## 2024-01-15 DIAGNOSIS — Z87891 Personal history of nicotine dependence: Secondary | ICD-10-CM | POA: Diagnosis not present

## 2024-01-15 DIAGNOSIS — Z8049 Family history of malignant neoplasm of other genital organs: Secondary | ICD-10-CM | POA: Diagnosis not present

## 2024-01-15 DIAGNOSIS — A6000 Herpesviral infection of urogenital system, unspecified: Secondary | ICD-10-CM | POA: Diagnosis not present

## 2024-01-15 DIAGNOSIS — Z1589 Genetic susceptibility to other disease: Secondary | ICD-10-CM | POA: Diagnosis not present

## 2024-01-15 DIAGNOSIS — R011 Cardiac murmur, unspecified: Secondary | ICD-10-CM | POA: Diagnosis not present

## 2024-01-15 DIAGNOSIS — Z800 Family history of malignant neoplasm of digestive organs: Secondary | ICD-10-CM

## 2024-01-15 DIAGNOSIS — Z8042 Family history of malignant neoplasm of prostate: Secondary | ICD-10-CM

## 2024-01-15 DIAGNOSIS — Z1501 Genetic susceptibility to malignant neoplasm of breast: Secondary | ICD-10-CM | POA: Diagnosis not present

## 2024-01-15 DIAGNOSIS — Z148 Genetic carrier of other disease: Secondary | ICD-10-CM | POA: Diagnosis not present

## 2024-01-15 DIAGNOSIS — Z1509 Genetic susceptibility to other malignant neoplasm: Secondary | ICD-10-CM | POA: Diagnosis not present

## 2024-01-15 DIAGNOSIS — D693 Immune thrombocytopenic purpura: Secondary | ICD-10-CM | POA: Diagnosis not present

## 2024-01-15 DIAGNOSIS — Z1379 Encounter for other screening for genetic and chromosomal anomalies: Secondary | ICD-10-CM | POA: Diagnosis not present

## 2024-01-15 DIAGNOSIS — R519 Headache, unspecified: Secondary | ICD-10-CM | POA: Diagnosis not present

## 2024-01-15 NOTE — Progress Notes (Unsigned)
 REFERRING PROVIDER: Eldonna Mays, MD [address] [place],  [place] [address]  PRIMARY PROVIDER:  Billy Knee, FNP  PRIMARY REASON FOR VISIT:  1. Family history of prostate cancer   2. Family history of uterine cancer   3. Family history of pancreatic cancer   4. PALB2 positive      HISTORY OF PRESENT ILLNESS:   Samantha Swanson, a 43 y.o. female, was seen for a [place] cancer genetics consultation at the request of Dr. 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  Samantha Swanson presents to clinic today to discuss the possibility of a hereditary predisposition to cancer, genetic testing, and to further clarify her future cancer risks, as well as potential cancer risks for family members.   Samantha Swanson is a 43 y.o. female with no personal history of cancer.    CANCER HISTORY:  Oncology History   No history exists.     RISK FACTORS:  Menarche was at age 12-14.  First live birth at age 26.  OCP use for approximately 2 years.  Ovaries intact: yes.  Hysterectomy: no.  Menopausal status: premenopausal.  HRT use: 0 years. Colonoscopy: no; abnormal. Mammogram within the last year: yes. Number of breast biopsies: 0. Up to date with pelvic exams: yes. Any excessive radiation exposure in the past: no  Past Medical History:  Diagnosis Date   Abnormal Pap smear    Allergy    Chronic headaches    Depression    not on meds, ok now   Family history of adverse reaction to anesthesia    mother of patient does not do well with the gas   Family history of pancreatic cancer    Family history of prostate cancer    Family history of uterine cancer    Genital herpes    Heart murmur    no work up   HSV infection    ITP (idiopathic thrombocytopenic purpura)    Migraines    Ovarian cyst    SAB (spontaneous abortion) 05/24/2021   Sciatica of right side 11/28/2010   Thrombocytopenia (HCC)    UTI (lower urinary tract infection)    Vaginal Pap  smear, abnormal    no repeat    Past Surgical History:  Procedure Laterality Date   INDUCED ABORTION     x2    Social History   Socioeconomic History   Marital status: Single    Spouse name: Not on file   Number of children: Not on file   Years of education: 16   Highest education level: Not on file  Occupational History   Occupation: IT consultant  Tobacco Use   Smoking status: Former    Current packs/day: 0.50    Average packs/day: 0.5 packs/day for 10.0 years (5.0 ttl pk-yrs)    Types: Cigarettes   Smokeless tobacco: Never   Tobacco comments:    2018  Vaping Use   Vaping status: Never Used  Substance and Sexual Activity   Alcohol use: Not Currently   Drug use: Yes    Frequency: 1.0 times per week    Types: Marijuana   Sexual activity: Yes    Birth control/protection: None  Other Topics Concern   Not on file  Social History Narrative   Not on file   Social Drivers of Health   Financial Resource Strain: Not on file  Food Insecurity: No Food Insecurity (01/02/2024)   Hunger Vital Sign  Worried About Programme researcher, broadcasting/film/video in the Last Year: Never true    Ran Out of Food in the Last Year: Never true  Transportation Needs: No Transportation Needs (01/02/2024)   PRAPARE - Administrator, Civil Service (Medical): No    Lack of Transportation (Non-Medical): No  Physical Activity: Not on file  Stress: Not on file  Social Connections: Not on file     FAMILY HISTORY:  We obtained a detailed, 4-generation family history.  Significant diagnoses are listed below: Family History  Problem Relation Age of Onset   Heart disease Mother    Hypertension Mother    Diabetes Mother    Osteoporosis Mother    Hypertension Father    Prostate cancer Father 27   Endometrial cancer Paternal Aunt 93   Lung cancer Maternal Grandmother    Lung cancer Paternal Grandmother    Pancreatic cancer Paternal Grandfather        or possibly colon cancer   Mental illness Other     Hypertension Other    Stroke Other    Mental illness Other    Heart disease Other    Mental illness Other    Anesthesia problems Neg Hx    Other Neg Hx    Breast cancer Neg Hx    Ovarian cancer Neg Hx      The patient has two daughters who are cancer free.  She has a brother and sister who are cancer free.  Both parents are living.  The patient's mother has not had cancer.  She had one brother who died of a GSW.  Her mother had lung cancer.  The patient's father had prostate cancer at 42. He has one sister who died of uterine cancer.  His father had either pancreatic cancer or possibly colon cancer.  Samantha Swanson is unaware of previous family history of genetic testing for hereditary cancer risks. There is no reported Ashkenazi Jewish ancestry. There is no known consanguinity.  GENETIC COUNSELING ASSESSMENT: Samantha Swanson is a 43 y.o. female with a family history of cancer which is somewhat suggestive of a hereditary cancer syndrome and predisposition to cancer given the pancreatic cancer in the family. We, therefore, discussed and recommended the following at today's visit.   DISCUSSION: We discussed that, in general, most cancer is not inherited in families, but instead is sporadic or familial. Sporadic cancers occur by chance and typically happen at older ages (>50 years) as this type of cancer is caused by genetic changes acquired during an individual's lifetime. Some families have more cancers than would be expected by chance; however, the ages or types of cancer are not consistent with a known genetic mutation or known genetic mutations have been ruled out. This type of familial cancer is thought to be due to a combination of multiple genetic, environmental, hormonal, and lifestyle factors. While this combination of factors likely increases the risk of cancer, the exact source of this risk is not currently identifiable or testable.  We discussed that 5 - 10% of cancer is hereditary. Each  type of cancer has it's own risk for being hereditary.  Most cases of pancreatic cancer are associated with BRCA mutations.  There are other genes that can be associated with hereditary pancreatic cancer syndromes.  These include ATM, CDKN2A and PALB2.  We discussed that testing is beneficial for several reasons including knowing how to follow individuals after completing their treatment, identifying whether potential treatment options such as PARP inhibitors would be  beneficial, and understand if other family members could be at risk for cancer and allow them to undergo genetic testing.   GENETIC TESTING: Prior to the time of Samantha Swanson's visit, she pursued genetic testing of the [facility] panel through [facility]. 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      Genetic testing did detect a Variant of Unknown Significance in the ATM gene called c.6537T>G (p.I2179M). At this time, it is unknown if this variant is associated with increased cancer risk or if this is a normal finding, but most variants such as this get reclassified to being inconsequential. It should not be used to make medical management decisions. With time, we suspect the lab will determine the significance of this variant, if any. If we do learn more about it, we will try to contact Samantha Swanson to discuss it further. However, it is important to stay in touch with us  periodically and keep the address and phone number up to date.  Clinical Information: Hereditary breast, ovarian, and pancreatic cancer risk due to pathogenic variants in PALB2 is characterized by an increased lifetime risk for, generally, adult-onset cancers including, breast, contralateral breast, female breast, ovarian, and pancreatic. We discussed that her family history of cancer is not characteristic of PALB2.    The cancers associated with PALB2 are:  Female breast cancer, up to  an 53% risk There is a higher incidence of triple negative breast cancer diagnosed in women with PALB2 mutations In women with a history of breast cancer, the cumulative risk for contralateral breast cancer 5 years after breast cancer diagnosis is 5-8%. Female breast cancer, up to a 0.9% risk Ovarian cancer, up to a 3-5% risk Pancreatic cancer, 3-5% risk Prostate cancer, increased risk.  Management Recommendations:  Breast Screening/Risk Reduction:  Women: Breast cancer screening includes: Breast awareness beginning at age 33 Monthly self-breast examination beginning at age 45 Clinical breast examination every 6-12 months beginning at age 73 or at the age of the earliest diagnosed breast cancer in the family, if onset was before age 78 Annual breast MRI with contrast beginning at age 88 (or annual mammograms with consideration of tomosynthesis if MRI is unavailable), although the age to initiate screening may be individualized based on family history Annual mammogram beginning at age 80 until age 29 with consideration of tomosynthesis with continuation of annual breast MRI with contrast The option of prophylactic bilateral risk-reducing mastectomy (RRM), removal of the breast tissue before cancer develops, is the best option for significantly decreasing the risk of developing breast cancer. Studies have shown mastectomies reduce the risk of breast cancer by 90-95% in women with a BRCA1 mutation - and may also be seen in the PALB2 population. Breast reconstruction can also be performed immediately following the mastectomy, depending on the type of reconstruction chosen. For women with a PALB2 pathogenic or likely pathogenic variant who are treated for breast cancer and have not had a bilateral mastectomy, screening with annual mammogram with consideration of tomosynthesis and breast MRI should continue as described above.  Males: Breast self-exam training and education starting at age 42  years Annual clinical breast exam starting at age 42 years  Consider annual mammogram starting at age 4 or 10 years before the earliest known female breast cancer in the family (whichever comes first).   Gynecological Cancer Screening/Risk Reduction: It is recommended that women with a PALB2 mutation consider having a risk-reducing salpingo oophorectomy (RRSO),  removal of the ovaries and fallopian tubes, between the ages of 72-50 or once childbearing is completed. Having a RRSO is estimated to reduce the risk of ovarian cancer by up to 96%. There is still a small risk of developing an ovarian-like cancer in the lining of the abdomen, called the peritoneum. For women under 45 who do not want to become pregnant and are considering birth control options, consideration of removing their fallopian tubes to lower the risk for ovarian cancer would be a potential option. Another benefit to having the ovaries removed is the risk reduction for breast cancer. If the ovaries are removed before menopause, the risk of developing breast cancer is reduced. Women undergoing a RRSO should be aware of the potential risks and benefits of concurrent hysterectomy. Hormone replacement therapy could be considered based on the physician's discretion. Individuals at risk for developing breast and ovarian cancer may benefit from the use of medication to reduce their risk for cancer. These medications are referred to as chemoprevention. For example, oral contraceptive use has been shown to reduce the risk of ovarian cancer by approximately 60% in BRCA1 mutation carriers if taken for at least 5 years. This risk reduction remains even after discontinuation of oral contraceptives. Ovarian cancer screening is an option for women who chose not to have a RRSO or who, as of yet, have not completed their family. Current screening methods for ovarian cancer are neither sensitive nor specific, meaning that often early stage ovarian cancer  cannot be diagnosed through this screening.  Screening can also be falsely positive with no cancer present. For this reason, RRSO is recommended over screening. If ovarian cancer screening is recommended by your physician, it could include: CA-125 blood tests Transvaginal ultrasounds Clinical pelvic exams   Pancreatic Cancer Screening/Risk Reduction: Avoid smoking, heavy alcohol use, and obesity. It has been suggested that pancreatic cancer screening be limited to those with a family history of pancreatic cancer (first- or second-degree relative). Ideally, screening should be performed in experienced centers utilizing a multidisciplinary approach under research conditions. Recommended screening could include annual endoscopic ultrasound (preferred) and/or MRI of the pancreas starting at age 18 or 29 years younger than the earliest age diagnosis in the family. CA19-9 testing may be considered based on the physician's discretion.  Prostate Cancer Screening There is emerging evidence for an association of an increased risk for prostate cancer.   Consider prostate cancer screening starting at 40 years.  Additional Considerations: Recent studies have suggested PARP inhibitors may be a beneficial chemotherapeutic agent for a subset of patients with PALB2-associated breast, ovarian, prostate, and pancreatic cancers. Clinical trials are currently in process to determine if and how these agents can be useful in the treatment of PALB2 cancer patients. Patients of reproductive age should be made aware of options for prenatal diagnosis and assisted reproduction including pre-implantation genetic diagnosis. Individuals with a single pathogenic PALB2 variant are carriers of Fanconi anemia. Fanconi anemia is characterized by developmental delay apparent from infancy, short stature, microcephaly, and coarse dysmorphic features. Individuals with Fanconi anemia are at risk for bone marrow failure and solid tumors.  For there to be a risk of Fanconi anemia in offspring, both the patient and their partner would each have to carry a pathogenic variant in PALB2. In this case, the risk of having an affected child is 25%.    This information is based on current understanding of the gene and may change in the future.   Implications for Family Members: Hereditary predisposition  to cancer due to pathogenic variants in the PALB2 gene has autosomal dominant inheritance. This means that an individual with a pathogenic variant has a 50% chance of passing the condition on to his/her offspring. Identification of a pathogenic variant allows for the recognition of at-risk relatives who can pursue testing for the familial variant.  Family members are encouraged to consider genetic testing for this familial pathogenic variant. As there are generally no childhood cancer risks associated with pathogenic variants in the PALB2 gene, individuals in the family are not recommended to have testing until they reach at least 41 years of age. They may contact our office at [phone] for more information or to schedule an appointment.  Complimentary testing for the familial variant, or the same genetic panel testing provided Samantha Swanson is available for 180 days to all first degree relatives. Family members who live outside of the area are encouraged to find a genetic counselor in their area by visiting: [email or web address].  Resources: FORCE (Facing Our Risk of Cancer Empowered) is a resource for those with a hereditary predisposition to develop cancer.  FORCE provides information about risk reduction, advocacy, legislation, and clinical trials.  Additionally, [employer]CE provides a platform for collaboration and support; which includes: peer navigation, message boards, local support groups, a toll-free helpline, research registry and recruitment, advocate training, published medical research, webinars, brochures, mastectomy  photos, and more.  For more information, visit www.facingourrisk.org  Plan: We will refer Samantha Swanson to the high risk breast clinic to discuss mammogram and MRI screening vs risk reducing mastectomy.  This clinic is held at [facility] on [address]. We will refer Samantha Swanson to [facility] for a discussion of pancreatic cancer screening.   Brochures were provided to Samantha Swanson about how to talk with children about hereditary mutations in the family We encouraged Samantha Swanson to remain in contact with us  on an annual basis so we can update her personal and family histories, and let her know of advances in cancer genetics that may benefit the family. Our contact number was provided. Samantha Swanson questions were answered to her satisfaction today, and she knows she is welcome to call anytime with additional questions.   Morrissa Shein P. Perri, MS, [facility] Licensed, Patent attorney [email or web address] phone: [phone]  In total, 60 minutes were spent on the date of the encounter in service to the patient including preparation, face-to-face consultation, documentation and care coordination.  The patient was seen alone.  Drs. Lanny Stalls, and/or [place] were available for questions, if needed.SABRA

## 2024-01-16 ENCOUNTER — Telehealth: Payer: Self-pay

## 2024-01-16 NOTE — Telephone Encounter (Signed)
-----   Message from Sandor LULLA Flatter sent at 01/16/2024  3:20 PM EDT ----- No problem.  Will schedule appointment with us .  Thanks for letting us  know.  VC ----- Message ----- From: Perri Darice CROME, Counselor Sent: 01/16/2024   1:06 PM EDT To: Aloha Wilhelmenia Raddle., MD; Sandor Flatter #  Good afternoon - we have placed a referral for this patient to discuss panc screening! Thank you!  KP

## 2024-01-16 NOTE — Telephone Encounter (Signed)
 Left message for patient to return call to further discuss new patient consult for pancreatic cancer screening.  Will continue efforts.

## 2024-01-22 NOTE — Telephone Encounter (Signed)
 Left message for patient to return call to further discuss new patient consult for pancreatic cancer screening.  Will continue efforts.

## 2024-01-25 NOTE — Telephone Encounter (Signed)
 Left message for patient to return call to further discuss new patient consult for pancreatic cancer screening.    Will send a MyChart message asking patient to return call to our office to schedule an appointment.

## 2024-02-05 ENCOUNTER — Encounter: Payer: Self-pay | Admitting: Internal Medicine

## 2024-02-05 ENCOUNTER — Ambulatory Visit: Admitting: Internal Medicine

## 2024-02-05 VITALS — BP 112/64 | Temp 98.0°F | Ht 64.0 in | Wt 180.8 lb

## 2024-02-05 DIAGNOSIS — F411 Generalized anxiety disorder: Secondary | ICD-10-CM | POA: Diagnosis not present

## 2024-02-05 DIAGNOSIS — R41840 Attention and concentration deficit: Secondary | ICD-10-CM | POA: Diagnosis not present

## 2024-02-05 NOTE — Progress Notes (Signed)
 [facility] [address] [place] [address] Dept: [phone] Dept Fax: [phone]    Subjective:   Samantha Swanson [birth year] 02/05/2024  Chief Complaint  Patient presents with   Follow-up    Anxiety  taking medication on as needed basses but things trying to handle it without medication.    HPI: Samantha Swanson presents today for re-assessment and management of chronic medical conditions.  Discussed the use of AI scribe software for clinical note transcription with the patient, who gave verbal consent to proceed.  History of Present Illness   Samantha Swanson is a 43 year old female who presents for follow-up on her anxiety management.  Her anxiety remains unchanged, and she has been prescribed Buspar  10 mg to be taken three times a day as needed. She does not take it consistently, but when she does, she notices a slight easing of her symptoms. Despite this, she experiences daily anxiety, which she describes as manageable when taking Buspar .  She has a long-standing history of anxiety since elementary school, with difficulty sleeping and a constantly active mind. This has led her to work from home. She describes herself as a 'jumping bug' and mentions a persistent need to complete tasks, an issue for about 20 years.  She suspects undiagnosed ADHD due to difficulty focusing and completing tasks. She describes her mind as being in 'eighteen different directions' and acknowledges that this inability to focus is distressing.   No SI/HI.       02/05/2024   10:19 AM 12/25/2023    2:32 PM 12/04/2023    3:42 PM  Depression screen PHQ 2/9  Decreased Interest  0 0  Down, Depressed, Hopeless 1 0 0  PHQ - 2 Score 1 0 0  Altered sleeping 1 1 2   Tired, decreased energy 0 1 0  Change in appetite 2 0 1  Feeling bad or failure about yourself  0 0 0  Trouble concentrating 3 2 3   Moving slowly or fidgety/restless 0 0 0  Suicidal  thoughts 0 0 0  PHQ-9 Score 7 4 6   Difficult doing work/chores Somewhat difficult Not difficult at all       02/05/2024   10:19 AM 12/25/2023    2:32 PM 12/04/2023    3:42 PM  GAD 7 : Generalized Anxiety Score  Nervous, Anxious, on Edge 3 1 3   Control/stop worrying 3 1 2   Worry too much - different things 3 1 2   Trouble relaxing 3 1 2   Restless 1 1 1   Easily annoyed or irritable 2 1 1   Afraid - awful might happen 0 0 1  Total GAD 7 Score 15 6 12   Anxiety Difficulty Somewhat difficult Somewhat difficult       The following portions of the patient's history were reviewed and updated as appropriate: past medical history, past surgical history, family history, social history, allergies, medications, and problem list.   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   Generalized anxiety disorder 12/25/2023   Vaginal odor 12/25/2023   HSV (herpes simplex virus) infection 01/07/2020   Anxiety 11/28/2010   Depression    Idiopathic thrombocytopenic purpura (HCC)    Migraines    Genital herpes    Past Medical History:  Diagnosis Date   Abnormal Pap smear    Allergy    Chronic headaches    Depression    not on meds, ok now   Family history of adverse reaction to anesthesia    mother of patient does not do well with the gas   Family history of pancreatic cancer    Family history of prostate cancer    Family history of uterine cancer    Genital herpes    Heart murmur    no work up   HSV infection    ITP (idiopathic thrombocytopenic purpura)    Migraines    Ovarian cyst    SAB (spontaneous abortion) 05/24/2021   Sciatica of right side 11/28/2010   Thrombocytopenia (HCC)    UTI (lower urinary tract infection)    Vaginal Pap smear, abnormal    no repeat   Past Surgical History:  Procedure  Laterality Date   INDUCED ABORTION     x2   Family History  Problem Relation Age of Onset   Heart disease Mother    Hypertension Mother    Diabetes Mother    Osteoporosis Mother    Hypertension Father    Prostate cancer Father 71   Endometrial cancer Paternal Aunt 59   Lung cancer Maternal Grandmother    Lung cancer Paternal Grandmother    Pancreatic cancer Paternal Grandfather        or possibly colon cancer   Mental illness Other    Hypertension Other    Stroke Other    Mental illness Other    Heart disease Other    Mental illness Other    Anesthesia problems Neg Hx    Other Neg Hx    Breast cancer Neg Hx    Ovarian cancer Neg Hx     Current Outpatient Medications:    Ascorbic Acid (VITAMIN C PO), Take 1 tablet by mouth daily., Disp: , Rfl:    busPIRone  (BUSPAR ) 10 MG tablet, Take 1 tablet (10 mg total) by mouth 3 (three) times daily as needed (anxiety or panic attacks)., Disp: 180 tablet, Rfl: 1   Cholecalciferol (VITAMIN D-3 PO), Take 1 tablet by mouth daily., Disp: , Rfl:    Cyanocobalamin (VITAMIN B-12 PO), Take 1 tablet by mouth daily., Disp: , Rfl:    valACYclovir  (VALTREX ) 500 MG tablet, Take 1 tablet (500 mg total) by mouth daily., Disp: 90 tablet, Rfl: 4 Allergies  Allergen Reactions   Latex Itching    burning     ROS: A complete ROS was performed with pertinent positives/negatives noted in the HPI. The remainder of the ROS are negative.    Objective:   Today's Vitals   02/05/24 0957  BP: 112/64  Temp: 98 F (36.7 C)  TempSrc: Temporal  Weight: 180 lb 12.8 oz (82 kg)  Height: 5' 4 (1.626 m)    GENERAL: Well-appearing, in NAD. Well nourished.  SKIN: Pink, warm and dry.  RESPIRATORY: Chest wall symmetrical. Respirations even and non-labored.   PSYCH/MENTAL STATUS: Alert, oriented x 3. Cooperative, anxious affect.   There are no preventive care reminders to display for this patient.   No results found for any visits on 02/05/24.  The ASCVD  Risk score (Arnett DK, et al., 2019) failed to calculate for the following reasons:   Cannot find a previous HDL lab   Cannot find a previous total cholesterol lab     Assessment & Plan:   Assessment and Plan    Generalized anxiety disorder Generalized  anxiety disorder persists with daily symptoms. Buspirone  provides some relief, but symptoms remain. Anxiety may be exacerbated by suspected ADHD, affecting focus and task completion. - Continue Buspirone  10 mg three times a day as needed. - Patient prefers to avoid daily anti-anxiety mediation at this time.  - Consider ashwagandha and magnesium at night for additional support. - Reassess anxiety management in six months or sooner if needed.  Attention and concentration deficit Suspected ADHD with symptoms of hyperactivity, inattention, and difficulty completing tasks. Symptoms have been present for many years but never formally diagnosed. ADHD may contribute to anxiety symptoms. She is open to evaluation and potential treatment to improve focus and alleviate anxiety. - Refer to Dr. Camelia Mountain at [facility] for ADHD evaluation and potential treatment.(Patient preference)  - Provide contact information for [facility]. - Consider ADHD medication if diagnosed, to improve focus and potentially alleviate anxiety.      Orders Placed This Encounter  Procedures   Ambulatory referral to Behavioral Health    Referral Priority:   Routine    Referral Type:   Psychiatric    Referral Reason:   Specialty Services Required    Requested Specialty:   Behavioral Health    Number of Visits Requested:   1   No images are attached to the encounter or orders placed in the encounter. No orders of the defined types were placed in this encounter.   Return in about 6 months (around 08/04/2024) for Chronic Condition follow up - fasting labs at appt.   Rosina Senters, FNP

## 2024-02-05 NOTE — Patient Instructions (Addendum)
 [facility]  [address] [place] [address], [place], [place] [address] Phone: [phone]

## 2024-05-07 ENCOUNTER — Ambulatory Visit (INDEPENDENT_AMBULATORY_CARE_PROVIDER_SITE_OTHER): Admitting: Nurse Practitioner

## 2024-05-07 ENCOUNTER — Encounter: Payer: Self-pay | Admitting: Nurse Practitioner

## 2024-05-07 ENCOUNTER — Other Ambulatory Visit (HOSPITAL_COMMUNITY)
Admission: RE | Admit: 2024-05-07 | Discharge: 2024-05-07 | Disposition: A | Source: Ambulatory Visit | Attending: Nurse Practitioner | Admitting: Nurse Practitioner

## 2024-05-07 VITALS — BP 116/76 | HR 82 | Temp 97.5°F | Ht 64.0 in | Wt 177.6 lb

## 2024-05-07 DIAGNOSIS — N898 Other specified noninflammatory disorders of vagina: Secondary | ICD-10-CM | POA: Insufficient documentation

## 2024-05-07 MED ORDER — METRONIDAZOLE 500 MG PO TABS: 500.0000 mg | ORAL_TABLET | Freq: Two times a day (BID) | ORAL | 0 refills | Status: AC

## 2024-05-07 MED ORDER — METRONIDAZOLE 0.75 % VA GEL: 1.0000 | VAGINAL | 1 refills | Status: AC

## 2024-05-07 NOTE — Patient Instructions (Signed)
 It was great to see you!  Start metronidazole  twice a day for 7 days, do not drink alcohol with this   Then start metronidazole  vaginal gel twice a week to prevent infection  You can try an over the counter azo vaginal health probiotic   Let's follow-up if symptoms worsen or any concerns   Take care,  Tinnie Harada, NP

## 2024-05-07 NOTE — Progress Notes (Signed)
 Acute Office Visit  Subjective:     Patient ID: Samantha Swanson, female    DOB: 1980-09-19, 43 y.o.   MRN: [id number]  Chief Complaint  Patient presents with   Vaginal Discharge    With odor reoccurring for more than 5 years    HPI Discussed the use of AI scribe software for clinical note transcription with the patient, who gave verbal consent to proceed.  History of Present Illness   Samantha Swanson is a 43 year old female who presents with vaginal discharge and odor.  She has recurrent vaginal discharge and odor about four times a year. This episode started a week before menses and last about two and a half weeks. The discharge is milky white with a faint green tinge and no yellow color. She denies itching, urinary burning, fever, abdominal pain, or pelvic pain.  She has genital herpes and has been in a monogamous relationship for eight years. She feels episodes often follow intercourse and attributes them to pH changes. She previously tried boric acid suppositories without benefit and prefers oral antibiotics to vaginal treatments due to discomfort. There have been no recent changes in detergents.     ROS See pertinent positives and negatives per HPI.     Objective:    BP 116/76 (BP Location: Left Arm, Patient Position: Sitting, Cuff Size: Normal)   Pulse 82   Temp (!) 97.5 F (36.4 C)   Ht 5' 4 (1.626 m)   Wt 177 lb 9.6 oz (80.6 kg)   LMP 04/28/2024 (Approximate)   SpO2 97%   BMI 30.48 kg/m    Physical Exam Vitals and nursing note reviewed.  Constitutional:      General: She is not in acute distress.    Appearance: Normal appearance.  HENT:     Head: Normocephalic.  Eyes:     Conjunctiva/sclera: Conjunctivae normal.  Pulmonary:     Effort: Pulmonary effort is normal.  Abdominal:     Palpations: Abdomen is soft.     Tenderness: There is no abdominal tenderness.  Musculoskeletal:     Cervical back: Normal range of motion.  Skin:    General: Skin is warm.   Neurological:     General: No focal deficit present.     Mental Status: She is alert and oriented to person, place, and time.  Psychiatric:        Mood and Affect: Mood normal.        Behavior: Behavior normal.        Thought Content: Thought content normal.        Judgment: Judgment normal.       Assessment & Plan:   Problem List Items Addressed This Visit       Other   Vaginal discharge - Primary   Recurrent episodes are linked to her menstrual cycle and sexual activity, with symptoms of malodorous discharge. She prefers oral antibiotics and has used boric acid as self-treatment. Bacterial vaginosis is likely given the recurrence and symptoms. A vaginal swab was performed to confirm the diagnosis. Prescribed metronidazole  500mg  BID x7 days PO for the current episode and vaginal gel twice weekly post-antibiotics to prevent recurrence. Recommended over-the-counter vaginal probiotics for prevention and advised continuation of boric acid as needed.        Relevant Medications   metroNIDAZOLE  (FLAGYL ) 500 MG tablet   metroNIDAZOLE  (METROGEL ) 0.75 % vaginal gel (Start on 05/08/2024)   Other Relevant Orders   Cervicovaginal ancillary only  Meds ordered this encounter  Medications   metroNIDAZOLE  (FLAGYL ) 500 MG tablet    Sig: Take 1 tablet (500 mg total) by mouth 2 (two) times daily for 7 days.    Dispense:  14 tablet    Refill:  0   metroNIDAZOLE  (METROGEL ) 0.75 % vaginal gel    Sig: Place 1 Applicatorful vaginally 2 (two) times a week. Start the week after finishing oral metronidazole     Dispense:  70 g    Refill:  1    Return if symptoms worsen or fail to improve.  Tinnie DELENA Harada, NP

## 2024-05-07 NOTE — Assessment & Plan Note (Signed)
 Recurrent episodes are linked to her menstrual cycle and sexual activity, with symptoms of malodorous discharge. She prefers oral antibiotics and has used boric acid as self-treatment. Bacterial vaginosis is likely given the recurrence and symptoms. A vaginal swab was performed to confirm the diagnosis. Prescribed metronidazole  500mg  BID x7 days PO for the current episode and vaginal gel twice weekly post-antibiotics to prevent recurrence. Recommended over-the-counter vaginal probiotics for prevention and advised continuation of boric acid as needed.

## 2024-05-08 ENCOUNTER — Ambulatory Visit: Payer: Self-pay | Admitting: Nurse Practitioner

## 2024-05-08 LAB — CERVICOVAGINAL ANCILLARY ONLY
Bacterial Vaginitis (gardnerella): POSITIVE — AB
Candida Glabrata: NEGATIVE
Candida Vaginitis: NEGATIVE
Chlamydia: NEGATIVE
Comment: NEGATIVE
Comment: NEGATIVE
Comment: NEGATIVE
Comment: NEGATIVE
Comment: NEGATIVE
Comment: NORMAL
Neisseria Gonorrhea: NEGATIVE
Trichomonas: NEGATIVE

## 2024-06-10 ENCOUNTER — Encounter: Payer: Self-pay | Admitting: Advanced Practice Midwife

## 2024-06-10 ENCOUNTER — Other Ambulatory Visit (HOSPITAL_COMMUNITY)
Admission: RE | Admit: 2024-06-10 | Discharge: 2024-06-10 | Disposition: A | Discharge status: Home / Self Care | Source: Ambulatory Visit | Attending: Advanced Practice Midwife | Admitting: Advanced Practice Midwife

## 2024-06-10 ENCOUNTER — Ambulatory Visit: Payer: Self-pay | Admitting: Advanced Practice Midwife

## 2024-06-10 VITALS — BP 108/70 | HR 82 | Ht 64.96 in | Wt 182.6 lb

## 2024-06-10 DIAGNOSIS — B9689 Other specified bacterial agents as the cause of diseases classified elsewhere: Secondary | ICD-10-CM

## 2024-06-10 DIAGNOSIS — N898 Other specified noninflammatory disorders of vagina: Secondary | ICD-10-CM | POA: Diagnosis present

## 2024-06-10 MED ORDER — METRONIDAZOLE 500 MG PO TABS: 500.0000 mg | ORAL_TABLET | Freq: Two times a day (BID) | ORAL | 1 refills | Status: AC

## 2024-06-10 NOTE — Progress Notes (Signed)
 Pt presents for recurring BV.  Last swab 05-07-24. Pt report BV symptoms after intercourse. Same partner for 8 years.  Pt c/o brown, clumpy discharge yesterday after intercourse.

## 2024-06-10 NOTE — Progress Notes (Signed)
" ° °  GYNECOLOGY PROGRESS NOTE  History:  44 y.o. [id number] presents to [facility] office today for problem gyn visit. She reports recurrent BV which resolves when she is not sexually active with her current partner of 8 years but resumes when she has sex.  She is taking a probiotic, wearing breathable cotton underwear, washing with unscented soaps and using boric acid at the first sign of symptoms.  After intercourse yesterday, there were small pieces of brown, in clumps, looked like wet cardboard, that she has never seen before. She treated BV with Metrogel  ~ 3 weeks ago.   She denies h/a, dizziness, shortness of breath, n/v, or fever/chills.    The following portions of the patient's history were reviewed and updated as appropriate: allergies, current medications, past family history, past medical history, past social history, past surgical history and problem list. Last pap smear on 12/04/2023 was normal, negative HRHPV.  Health Maintenance Due  Topic Date Due   COVID-19 Vaccine (1 - 2025-26 season) Never done     Review of Systems:  Pertinent items are noted in HPI.   Objective:  Physical Exam Blood pressure 108/70, pulse 82, height 5' 4.96 (1.65 m), weight 182 lb 9.6 oz (82.8 kg). VS reviewed, nursing note reviewed,  Constitutional: well developed, well nourished, no distress HEENT: normocephalic CV: normal rate Pulm/chest wall: normal effort Breast Exam: deferred Abdomen: soft Neuro: alert and oriented x 3 Skin: warm, dry Psych: affect normal Pelvic exam: Cervix pink, visually closed, without lesion, scant white creamy discharge, vaginal walls and external genitalia normal Bimanual exam: Cervix 0/long/high, firm, anterior, neg CMT, uterus nontender, nonenlarged, adnexa without tenderness, enlargement, or mass  Assessment & Plan:  1. Vaginal discharge (Primary) --On exam today, gray/brown clumps most c/w medicine used a few weeks ago.  No other causes for concern.   -  Cervicovaginal ancillary only( [place])  2. Bacterial vaginosis --Recurrent BV.  Treat now with Flagyl  and prescription written separately to treat female partner.  - metroNIDAZOLE  (FLAGYL ) 500 MG tablet; Take 1 tablet (500 mg total) by mouth 2 (two) times daily for 7 days.  Dispense: 14 tablet; Refill: 1   No follow-ups on file.   Olam Boards, CNM 12:49 PM  "

## 2024-06-11 LAB — CERVICOVAGINAL ANCILLARY ONLY
Bacterial Vaginitis (gardnerella): NEGATIVE
Candida Glabrata: NEGATIVE
Candida Vaginitis: NEGATIVE
Chlamydia: NEGATIVE
Comment: NEGATIVE
Comment: NEGATIVE
Comment: NEGATIVE
Comment: NEGATIVE
Comment: NEGATIVE
Comment: NORMAL
Neisseria Gonorrhea: NEGATIVE
Trichomonas: NEGATIVE

## 2024-06-16 ENCOUNTER — Ambulatory Visit: Admitting: Internal Medicine

## 2024-08-04 ENCOUNTER — Ambulatory Visit: Admitting: Internal Medicine
